# Patient Record
Sex: Female | Born: 1935 | Race: White | Hispanic: No | Smoking: Former smoker
Health system: Southern US, Community
[De-identification: ages and names within clinical notes are randomized; demographics above are authoritative.]

## PROBLEM LIST (undated history)

## (undated) DIAGNOSIS — E785 Hyperlipidemia, unspecified: Secondary | ICD-10-CM

## (undated) DIAGNOSIS — Z5189 Encounter for other specified aftercare: Secondary | ICD-10-CM

## (undated) DIAGNOSIS — I10 Essential (primary) hypertension: Secondary | ICD-10-CM

## (undated) DIAGNOSIS — G629 Polyneuropathy, unspecified: Secondary | ICD-10-CM

## (undated) DIAGNOSIS — K219 Gastro-esophageal reflux disease without esophagitis: Secondary | ICD-10-CM

## (undated) DIAGNOSIS — K2980 Duodenitis without bleeding: Secondary | ICD-10-CM

## (undated) DIAGNOSIS — K2990 Gastroduodenitis, unspecified, without bleeding: Secondary | ICD-10-CM

## (undated) DIAGNOSIS — Z860100 Personal history of colon polyps, unspecified: Secondary | ICD-10-CM

## (undated) DIAGNOSIS — K2970 Gastritis, unspecified, without bleeding: Secondary | ICD-10-CM

## (undated) DIAGNOSIS — C50919 Malignant neoplasm of unspecified site of unspecified female breast: Secondary | ICD-10-CM

## (undated) DIAGNOSIS — J8410 Pulmonary fibrosis, unspecified: Secondary | ICD-10-CM

## (undated) DIAGNOSIS — K5730 Diverticulosis of large intestine without perforation or abscess without bleeding: Secondary | ICD-10-CM

## (undated) DIAGNOSIS — K648 Other hemorrhoids: Secondary | ICD-10-CM

## (undated) HISTORY — PX: MASTECTOMY: SHX3

## (undated) HISTORY — DX: Diverticulosis of large intestine without perforation or abscess without bleeding: K57.30

## (undated) HISTORY — DX: Duodenitis without bleeding: K29.80

## (undated) HISTORY — DX: Malignant neoplasm of unspecified site of unspecified female breast: C50.919

## (undated) HISTORY — DX: Hyperlipidemia, unspecified: E78.5

## (undated) HISTORY — DX: Gastritis, unspecified, without bleeding: K29.70

## (undated) HISTORY — DX: Encounter for other specified aftercare: Z51.89

## (undated) HISTORY — DX: Essential (primary) hypertension: I10

## (undated) HISTORY — DX: Personal history of colon polyps, unspecified: Z86.0100

## (undated) HISTORY — PX: APPENDECTOMY: SHX54

## (undated) HISTORY — PX: TONSILLECTOMY: SUR1361

## (undated) HISTORY — DX: Gastroduodenitis, unspecified, without bleeding: K29.90

## (undated) HISTORY — DX: Other hemorrhoids: K64.8

## (undated) HISTORY — DX: Personal history of colonic polyps: Z86.010

## (undated) HISTORY — PX: TOTAL ABDOMINAL HYSTERECTOMY: SHX209

## (undated) HISTORY — DX: Gastro-esophageal reflux disease without esophagitis: K21.9

## (undated) HISTORY — DX: Polyneuropathy, unspecified: G62.9

---

## 1998-06-26 ENCOUNTER — Other Ambulatory Visit: Admission: RE | Admit: 1998-06-26 | Discharge: 1998-06-26 | Payer: Self-pay | Admitting: Family Medicine

## 1999-06-18 ENCOUNTER — Other Ambulatory Visit: Admission: RE | Admit: 1999-06-18 | Discharge: 1999-06-18 | Payer: Self-pay | Admitting: Family Medicine

## 1999-11-24 ENCOUNTER — Encounter: Admission: RE | Admit: 1999-11-24 | Discharge: 1999-11-24 | Payer: Self-pay | Admitting: Hematology and Oncology

## 1999-11-24 ENCOUNTER — Encounter: Payer: Self-pay | Admitting: Hematology and Oncology

## 2000-07-28 ENCOUNTER — Other Ambulatory Visit: Admission: RE | Admit: 2000-07-28 | Discharge: 2000-07-28 | Payer: Self-pay | Admitting: Family Medicine

## 2000-09-29 ENCOUNTER — Other Ambulatory Visit: Admission: RE | Admit: 2000-09-29 | Discharge: 2000-09-29 | Payer: Self-pay | Admitting: Gastroenterology

## 2000-09-29 ENCOUNTER — Encounter (INDEPENDENT_AMBULATORY_CARE_PROVIDER_SITE_OTHER): Payer: Self-pay | Admitting: Specialist

## 2000-11-24 ENCOUNTER — Encounter: Payer: Self-pay | Admitting: Family Medicine

## 2000-11-24 ENCOUNTER — Encounter: Admission: RE | Admit: 2000-11-24 | Discharge: 2000-11-24 | Payer: Self-pay | Admitting: Family Medicine

## 2001-08-09 ENCOUNTER — Other Ambulatory Visit: Admission: RE | Admit: 2001-08-09 | Discharge: 2001-08-09 | Payer: Self-pay | Admitting: Family Medicine

## 2001-11-28 ENCOUNTER — Encounter: Payer: Self-pay | Admitting: Family Medicine

## 2001-11-28 ENCOUNTER — Encounter: Admission: RE | Admit: 2001-11-28 | Discharge: 2001-11-28 | Payer: Self-pay | Admitting: Family Medicine

## 2002-11-14 ENCOUNTER — Encounter: Payer: Self-pay | Admitting: Family Medicine

## 2002-11-14 ENCOUNTER — Encounter: Admission: RE | Admit: 2002-11-14 | Discharge: 2002-11-14 | Payer: Self-pay | Admitting: Family Medicine

## 2003-11-15 ENCOUNTER — Encounter: Admission: RE | Admit: 2003-11-15 | Discharge: 2003-11-15 | Payer: Self-pay | Admitting: Family Medicine

## 2004-01-31 ENCOUNTER — Encounter: Admission: RE | Admit: 2004-01-31 | Discharge: 2004-01-31 | Payer: Self-pay | Admitting: Family Medicine

## 2004-11-05 ENCOUNTER — Ambulatory Visit: Payer: Self-pay | Admitting: Family Medicine

## 2004-11-12 ENCOUNTER — Encounter: Admission: RE | Admit: 2004-11-12 | Discharge: 2004-11-12 | Payer: Self-pay | Admitting: Family Medicine

## 2004-12-03 ENCOUNTER — Encounter: Admission: RE | Admit: 2004-12-03 | Discharge: 2004-12-03 | Payer: Self-pay | Admitting: Family Medicine

## 2005-06-11 ENCOUNTER — Ambulatory Visit: Payer: Self-pay | Admitting: Family Medicine

## 2005-11-09 ENCOUNTER — Encounter: Admission: RE | Admit: 2005-11-09 | Discharge: 2005-11-09 | Payer: Self-pay | Admitting: Family Medicine

## 2005-11-09 ENCOUNTER — Ambulatory Visit: Payer: Self-pay | Admitting: Family Medicine

## 2005-12-08 ENCOUNTER — Encounter: Admission: RE | Admit: 2005-12-08 | Discharge: 2005-12-08 | Payer: Self-pay | Admitting: Family Medicine

## 2006-11-11 ENCOUNTER — Ambulatory Visit: Payer: Self-pay | Admitting: Family Medicine

## 2006-11-11 ENCOUNTER — Encounter: Admission: RE | Admit: 2006-11-11 | Discharge: 2006-11-11 | Payer: Self-pay | Admitting: Family Medicine

## 2006-11-11 LAB — CONVERTED CEMR LAB
ALT: 13 units/L (ref 0–40)
AST: 14 units/L (ref 0–37)
Alkaline Phosphatase: 90 units/L (ref 39–117)
BUN: 13 mg/dL (ref 6–23)
Basophils Absolute: 0 10*3/uL (ref 0.0–0.1)
Basophils Relative: 1 % (ref 0.0–1.0)
Chol/HDL Ratio, serum: 3
Cholesterol: 209 mg/dL (ref 0–200)
Creatinine, Ser: 1 mg/dL (ref 0.4–1.2)
Eosinophil percent: 2.1 % (ref 0.0–5.0)
Glucose, Bld: 106 mg/dL — ABNORMAL HIGH (ref 70–99)
HCT: 40.4 % (ref 36.0–46.0)
HDL: 68.7 mg/dL (ref >39.0)
Hemoglobin: 13.3 g/dL (ref 12.0–15.0)
LDL DIRECT: 132 mg/dL
Lymphocytes Relative: 42.5 % (ref 12.0–46.0)
MCHC: 33 g/dL (ref 30.0–36.0)
MCV: 98 fL (ref 78.0–100.0)
Monocytes Absolute: 0.5 10*3/uL (ref 0.2–0.7)
Monocytes Relative: 16.3 % — ABNORMAL HIGH (ref 3.0–11.0)
Neutro Abs: 1.2 10*3/uL — ABNORMAL LOW (ref 1.4–7.7)
Neutrophils Relative %: 38.1 % — ABNORMAL LOW (ref 43.0–77.0)
Platelets: 199 10*3/uL (ref 150–400)
Potassium: 4.3 meq/L (ref 3.5–5.1)
RBC: 4.12 M/uL (ref 3.87–5.11)
RDW: 13.6 % (ref 11.5–14.6)
Sodium: 141 meq/L (ref 135–145)
TSH: 0.55 microintl units/mL (ref 0.35–5.50)
Triglyceride fasting, serum: 48 mg/dL (ref 0–149)
VLDL: 10 mg/dL (ref 0–40)
WBC: 3.2 10*3/uL — ABNORMAL LOW (ref 4.5–10.5)

## 2006-12-09 ENCOUNTER — Encounter: Admission: RE | Admit: 2006-12-09 | Discharge: 2006-12-09 | Payer: Self-pay | Admitting: Family Medicine

## 2006-12-23 ENCOUNTER — Emergency Department (HOSPITAL_COMMUNITY): Admission: EM | Admit: 2006-12-23 | Discharge: 2006-12-24 | Payer: Self-pay | Admitting: Emergency Medicine

## 2007-03-01 ENCOUNTER — Ambulatory Visit: Payer: Self-pay | Admitting: Family Medicine

## 2007-03-03 ENCOUNTER — Ambulatory Visit: Payer: Self-pay | Admitting: Family Medicine

## 2007-11-23 ENCOUNTER — Ambulatory Visit: Payer: Self-pay | Admitting: Family Medicine

## 2007-11-23 DIAGNOSIS — G609 Hereditary and idiopathic neuropathy, unspecified: Secondary | ICD-10-CM | POA: Insufficient documentation

## 2007-11-23 DIAGNOSIS — H9190 Unspecified hearing loss, unspecified ear: Secondary | ICD-10-CM | POA: Insufficient documentation

## 2007-11-23 DIAGNOSIS — K219 Gastro-esophageal reflux disease without esophagitis: Secondary | ICD-10-CM

## 2007-11-23 DIAGNOSIS — I10 Essential (primary) hypertension: Secondary | ICD-10-CM

## 2007-11-23 DIAGNOSIS — E785 Hyperlipidemia, unspecified: Secondary | ICD-10-CM | POA: Insufficient documentation

## 2007-11-23 LAB — CONVERTED CEMR LAB
ALT: 16 units/L (ref 0–35)
AST: 18 units/L (ref 0–37)
Albumin: 3.9 g/dL (ref 3.5–5.2)
Alkaline Phosphatase: 90 units/L (ref 39–117)
BUN: 13 mg/dL (ref 6–23)
Basophils Absolute: 0 10*3/uL (ref 0.0–0.1)
Basophils Relative: 0.8 % (ref 0.0–1.0)
Bilirubin, Direct: 0.2 mg/dL (ref 0.0–0.3)
CO2: 30 meq/L (ref 19–32)
Calcium: 9.4 mg/dL (ref 8.4–10.5)
Chloride: 102 meq/L (ref 96–112)
Cholesterol: 224 mg/dL (ref 0–200)
Creatinine, Ser: 0.9 mg/dL (ref 0.4–1.2)
Direct LDL: 130 mg/dL
Eosinophils Absolute: 0.1 10*3/uL (ref 0.0–0.6)
Eosinophils Relative: 1.7 % (ref 0.0–5.0)
GFR calc Af Amer: 77 mL/min
GFR calc non Af Amer: 64 mL/min
Glucose, Bld: 85 mg/dL (ref 70–99)
HCT: 41.8 % (ref 36.0–46.0)
HDL: 76 mg/dL (ref >39.0)
Hemoglobin: 14.1 g/dL (ref 12.0–15.0)
Lymphocytes Relative: 37 % (ref 12.0–46.0)
MCHC: 33.6 g/dL (ref 30.0–36.0)
MCV: 98.4 fL (ref 78.0–100.0)
Monocytes Absolute: 0.6 10*3/uL (ref 0.2–0.7)
Monocytes Relative: 14.9 % — ABNORMAL HIGH (ref 3.0–11.0)
Neutro Abs: 1.6 10*3/uL (ref 1.4–7.7)
Neutrophils Relative %: 45.6 % (ref 43.0–77.0)
Platelets: 195 10*3/uL (ref 150–400)
Potassium: 4.3 meq/L (ref 3.5–5.1)
RBC: 4.25 M/uL (ref 3.87–5.11)
RDW: 13.8 % (ref 11.5–14.6)
Sodium: 140 meq/L (ref 135–145)
TSH: 0.58 microintl units/mL (ref 0.35–5.50)
Total Bilirubin: 1.3 mg/dL — ABNORMAL HIGH (ref 0.3–1.2)
Total CHOL/HDL Ratio: 2.9
Total Protein: 6.6 g/dL (ref 6.0–8.3)
Triglycerides: 54 mg/dL (ref 0–149)
VLDL: 11 mg/dL (ref 0–40)
WBC: 3.7 10*3/uL — ABNORMAL LOW (ref 4.5–10.5)

## 2007-11-29 ENCOUNTER — Ambulatory Visit: Payer: Self-pay | Admitting: Family Medicine

## 2007-12-14 ENCOUNTER — Encounter: Admission: RE | Admit: 2007-12-14 | Discharge: 2007-12-14 | Payer: Self-pay | Admitting: Family Medicine

## 2007-12-26 ENCOUNTER — Ambulatory Visit: Payer: Self-pay | Admitting: Gastroenterology

## 2008-01-05 ENCOUNTER — Encounter: Payer: Self-pay | Admitting: Gastroenterology

## 2008-01-05 ENCOUNTER — Ambulatory Visit: Payer: Self-pay | Admitting: Gastroenterology

## 2008-10-22 ENCOUNTER — Ambulatory Visit: Payer: Self-pay | Admitting: Family Medicine

## 2008-10-22 LAB — CONVERTED CEMR LAB: Blood Glucose, Fingerstick: 96

## 2008-10-30 ENCOUNTER — Ambulatory Visit: Payer: Self-pay | Admitting: Family Medicine

## 2008-11-28 ENCOUNTER — Ambulatory Visit: Payer: Self-pay | Admitting: Family Medicine

## 2008-11-28 DIAGNOSIS — Z853 Personal history of malignant neoplasm of breast: Secondary | ICD-10-CM

## 2008-11-28 LAB — CONVERTED CEMR LAB
Bilirubin Urine: NEGATIVE
Glucose, Urine, Semiquant: NEGATIVE
Ketones, urine, test strip: NEGATIVE
Nitrite: NEGATIVE
Protein, U semiquant: NEGATIVE
Specific Gravity, Urine: 1.015
Urobilinogen, UA: 0.2
pH: 7

## 2008-11-29 ENCOUNTER — Telehealth: Payer: Self-pay | Admitting: *Deleted

## 2008-12-04 ENCOUNTER — Telehealth: Payer: Self-pay | Admitting: Family Medicine

## 2008-12-06 LAB — CONVERTED CEMR LAB
ALT: 11 units/L (ref 0–35)
AST: 14 units/L (ref 0–37)
Albumin: 3.6 g/dL (ref 3.5–5.2)
Alkaline Phosphatase: 65 units/L (ref 39–117)
BUN: 15 mg/dL (ref 6–23)
Basophils Absolute: 0 10*3/uL (ref 0.0–0.1)
Basophils Relative: 0.8 % (ref 0.0–3.0)
Bilirubin, Direct: 0.1 mg/dL (ref 0.0–0.3)
CO2: 30 meq/L (ref 19–32)
Calcium: 9.3 mg/dL (ref 8.4–10.5)
Chloride: 105 meq/L (ref 96–112)
Cholesterol: 191 mg/dL (ref 0–200)
Creatinine, Ser: 0.8 mg/dL (ref 0.4–1.2)
Eosinophils Absolute: 0.1 10*3/uL (ref 0.0–0.7)
Eosinophils Relative: 1.5 % (ref 0.0–5.0)
GFR calc Af Amer: 89 mL/min
GFR calc non Af Amer: 73 mL/min
Glucose, Bld: 98 mg/dL (ref 70–99)
HCT: 38.1 % (ref 36.0–46.0)
HDL: 74.3 mg/dL (ref >39.0)
Hemoglobin: 13.1 g/dL (ref 12.0–15.0)
LDL Cholesterol: 103 mg/dL — ABNORMAL HIGH (ref 0–99)
Lymphocytes Relative: 32.5 % (ref 12.0–46.0)
MCHC: 34.4 g/dL (ref 30.0–36.0)
MCV: 97.9 fL (ref 78.0–100.0)
Monocytes Absolute: 0.5 10*3/uL (ref 0.1–1.0)
Monocytes Relative: 14.4 % — ABNORMAL HIGH (ref 3.0–12.0)
Neutro Abs: 1.8 10*3/uL (ref 1.4–7.7)
Neutrophils Relative %: 50.8 % (ref 43.0–77.0)
Platelets: 140 10*3/uL — ABNORMAL LOW (ref 150–400)
Potassium: 3.7 meq/L (ref 3.5–5.1)
RBC: 3.89 M/uL (ref 3.87–5.11)
RDW: 14 % (ref 11.5–14.6)
Sodium: 140 meq/L (ref 135–145)
TSH: 0.62 microintl units/mL (ref 0.35–5.50)
Total Bilirubin: 1.5 mg/dL — ABNORMAL HIGH (ref 0.3–1.2)
Total CHOL/HDL Ratio: 2.6
Total Protein: 6.5 g/dL (ref 6.0–8.3)
Triglycerides: 68 mg/dL (ref 0–149)
VLDL: 14 mg/dL (ref 0–40)
WBC: 3.5 10*3/uL — ABNORMAL LOW (ref 4.5–10.5)

## 2008-12-10 ENCOUNTER — Ambulatory Visit: Payer: Self-pay | Admitting: Family Medicine

## 2008-12-18 ENCOUNTER — Encounter: Admission: RE | Admit: 2008-12-18 | Discharge: 2008-12-18 | Payer: Self-pay | Admitting: Family Medicine

## 2008-12-27 ENCOUNTER — Telehealth: Payer: Self-pay | Admitting: *Deleted

## 2008-12-31 ENCOUNTER — Ambulatory Visit: Payer: Self-pay | Admitting: Family Medicine

## 2008-12-31 ENCOUNTER — Telehealth: Payer: Self-pay | Admitting: Family Medicine

## 2009-01-28 ENCOUNTER — Ambulatory Visit: Payer: Self-pay | Admitting: Family Medicine

## 2009-04-04 ENCOUNTER — Ambulatory Visit: Payer: Self-pay | Admitting: Family Medicine

## 2009-04-04 DIAGNOSIS — J189 Pneumonia, unspecified organism: Secondary | ICD-10-CM

## 2009-04-04 DIAGNOSIS — R05 Cough: Secondary | ICD-10-CM | POA: Insufficient documentation

## 2009-04-04 DIAGNOSIS — R059 Cough, unspecified: Secondary | ICD-10-CM | POA: Insufficient documentation

## 2009-04-18 ENCOUNTER — Ambulatory Visit: Payer: Self-pay | Admitting: Family Medicine

## 2009-05-02 ENCOUNTER — Ambulatory Visit: Payer: Self-pay | Admitting: Family Medicine

## 2009-05-20 ENCOUNTER — Telehealth: Payer: Self-pay | Admitting: Speech Pathology

## 2009-05-20 ENCOUNTER — Ambulatory Visit: Payer: Self-pay | Admitting: Family Medicine

## 2009-05-23 ENCOUNTER — Ambulatory Visit: Payer: Self-pay | Admitting: Cardiology

## 2009-05-23 ENCOUNTER — Telehealth: Payer: Self-pay | Admitting: Family Medicine

## 2009-05-31 ENCOUNTER — Ambulatory Visit: Payer: Self-pay | Admitting: Pulmonary Disease

## 2009-05-31 DIAGNOSIS — J8410 Pulmonary fibrosis, unspecified: Secondary | ICD-10-CM

## 2009-05-31 LAB — CONVERTED CEMR LAB
Anti Nuclear Antibody(ANA): NEGATIVE
Rheumatoid fact SerPl-aCnc: <20 intl units/mL (ref 0.0–20.0)
Sed Rate: 74 mm/hr — ABNORMAL HIGH (ref 0–22)

## 2009-06-05 ENCOUNTER — Ambulatory Visit: Payer: Self-pay | Admitting: Pulmonary Disease

## 2009-06-21 ENCOUNTER — Ambulatory Visit: Payer: Self-pay | Admitting: Pulmonary Disease

## 2009-06-27 ENCOUNTER — Telehealth: Payer: Self-pay | Admitting: *Deleted

## 2009-09-23 ENCOUNTER — Ambulatory Visit: Payer: Self-pay | Admitting: Pulmonary Disease

## 2009-09-23 DIAGNOSIS — J209 Acute bronchitis, unspecified: Secondary | ICD-10-CM

## 2009-10-16 ENCOUNTER — Ambulatory Visit: Payer: Self-pay | Admitting: Pulmonary Disease

## 2009-11-27 ENCOUNTER — Ambulatory Visit: Payer: Self-pay | Admitting: Pulmonary Disease

## 2009-12-16 ENCOUNTER — Ambulatory Visit: Payer: Self-pay | Admitting: Family Medicine

## 2009-12-16 LAB — CONVERTED CEMR LAB
Bilirubin Urine: NEGATIVE
Glucose, Urine, Semiquant: NEGATIVE
Ketones, urine, test strip: NEGATIVE
Nitrite: NEGATIVE
Specific Gravity, Urine: 1.015
Urobilinogen, UA: 0.2
pH: 7

## 2009-12-17 ENCOUNTER — Encounter: Payer: Self-pay | Admitting: Family Medicine

## 2009-12-17 LAB — CONVERTED CEMR LAB
Basophils Absolute: 0.1 10*3/uL (ref 0.0–0.1)
Basophils Relative: 1 % (ref 0–1)
Eosinophils Absolute: 0.3 10*3/uL (ref 0.0–0.7)
Eosinophils Relative: 6 % — ABNORMAL HIGH (ref 0–5)
HCT: 37.7 % (ref 36.0–46.0)
Hemoglobin: 12.4 g/dL (ref 12.0–15.0)
Lymphocytes Relative: 45 % (ref 12–46)
Lymphs Abs: 1.8 10*3/uL (ref 0.7–4.0)
MCHC: 32.9 g/dL (ref 30.0–36.0)
MCV: 93.8 fL (ref 78.0–100.0)
Monocytes Absolute: 0.6 10*3/uL (ref 0.1–1.0)
Monocytes Relative: 16 % — ABNORMAL HIGH (ref 3–12)
Neutro Abs: 1.3 10*3/uL — ABNORMAL LOW (ref 1.7–7.7)
Neutrophils Relative %: 32 % — ABNORMAL LOW (ref 43–77)
Platelets: 239 10*3/uL (ref 150–400)
RBC: 4.02 M/uL (ref 3.87–5.11)
RDW: 14.6 % (ref 11.5–15.5)
WBC: 4 10*3/uL (ref 4.0–10.5)

## 2009-12-19 ENCOUNTER — Encounter: Admission: RE | Admit: 2009-12-19 | Discharge: 2009-12-19 | Payer: Self-pay | Admitting: Family Medicine

## 2009-12-19 LAB — CONVERTED CEMR LAB
ALT: 11 units/L (ref 0–35)
AST: 13 units/L (ref 0–37)
Albumin: 3.6 g/dL (ref 3.5–5.2)
Alkaline Phosphatase: 92 units/L (ref 39–117)
BUN: 18 mg/dL (ref 6–23)
Bilirubin, Direct: 0 mg/dL (ref 0.0–0.3)
CO2: 29 meq/L (ref 19–32)
Calcium: 9.6 mg/dL (ref 8.4–10.5)
Chloride: 105 meq/L (ref 96–112)
Cholesterol: 188 mg/dL (ref 0–200)
Creatinine, Ser: 1 mg/dL (ref 0.4–1.2)
GFR calc non Af Amer: 56.4 mL/min (ref >60)
Glucose, Bld: 102 mg/dL — ABNORMAL HIGH (ref 70–99)
HDL: 74.8 mg/dL (ref >39.00)
LDL Cholesterol: 102 mg/dL — ABNORMAL HIGH (ref 0–99)
Potassium: 4.9 meq/L (ref 3.5–5.1)
Sodium: 141 meq/L (ref 135–145)
TSH: 0.43 microintl units/mL (ref 0.35–5.50)
Total Bilirubin: 1.2 mg/dL (ref 0.3–1.2)
Total CHOL/HDL Ratio: 3
Total Protein: 7.7 g/dL (ref 6.0–8.3)
Triglycerides: 58 mg/dL (ref 0.0–149.0)
VLDL: 11.6 mg/dL (ref 0.0–40.0)

## 2010-03-27 ENCOUNTER — Ambulatory Visit: Payer: Self-pay | Admitting: Pulmonary Disease

## 2010-05-29 ENCOUNTER — Ambulatory Visit: Payer: Self-pay | Admitting: Pulmonary Disease

## 2010-07-28 ENCOUNTER — Ambulatory Visit: Payer: Self-pay | Admitting: Pulmonary Disease

## 2010-07-28 ENCOUNTER — Telehealth: Payer: Self-pay | Admitting: Family Medicine

## 2010-09-07 IMAGING — CR DG CHEST 2V
2 series · 2 of 2 positions shown · non-contrast
Comparison: Chest radiograph 04/04/2009, 12/02/2008, 11/29/2007

CLINICAL DATA: Recurrent coughing and short of breath

CHEST - 2 VIEW

[view not recorded (1 of 2)]
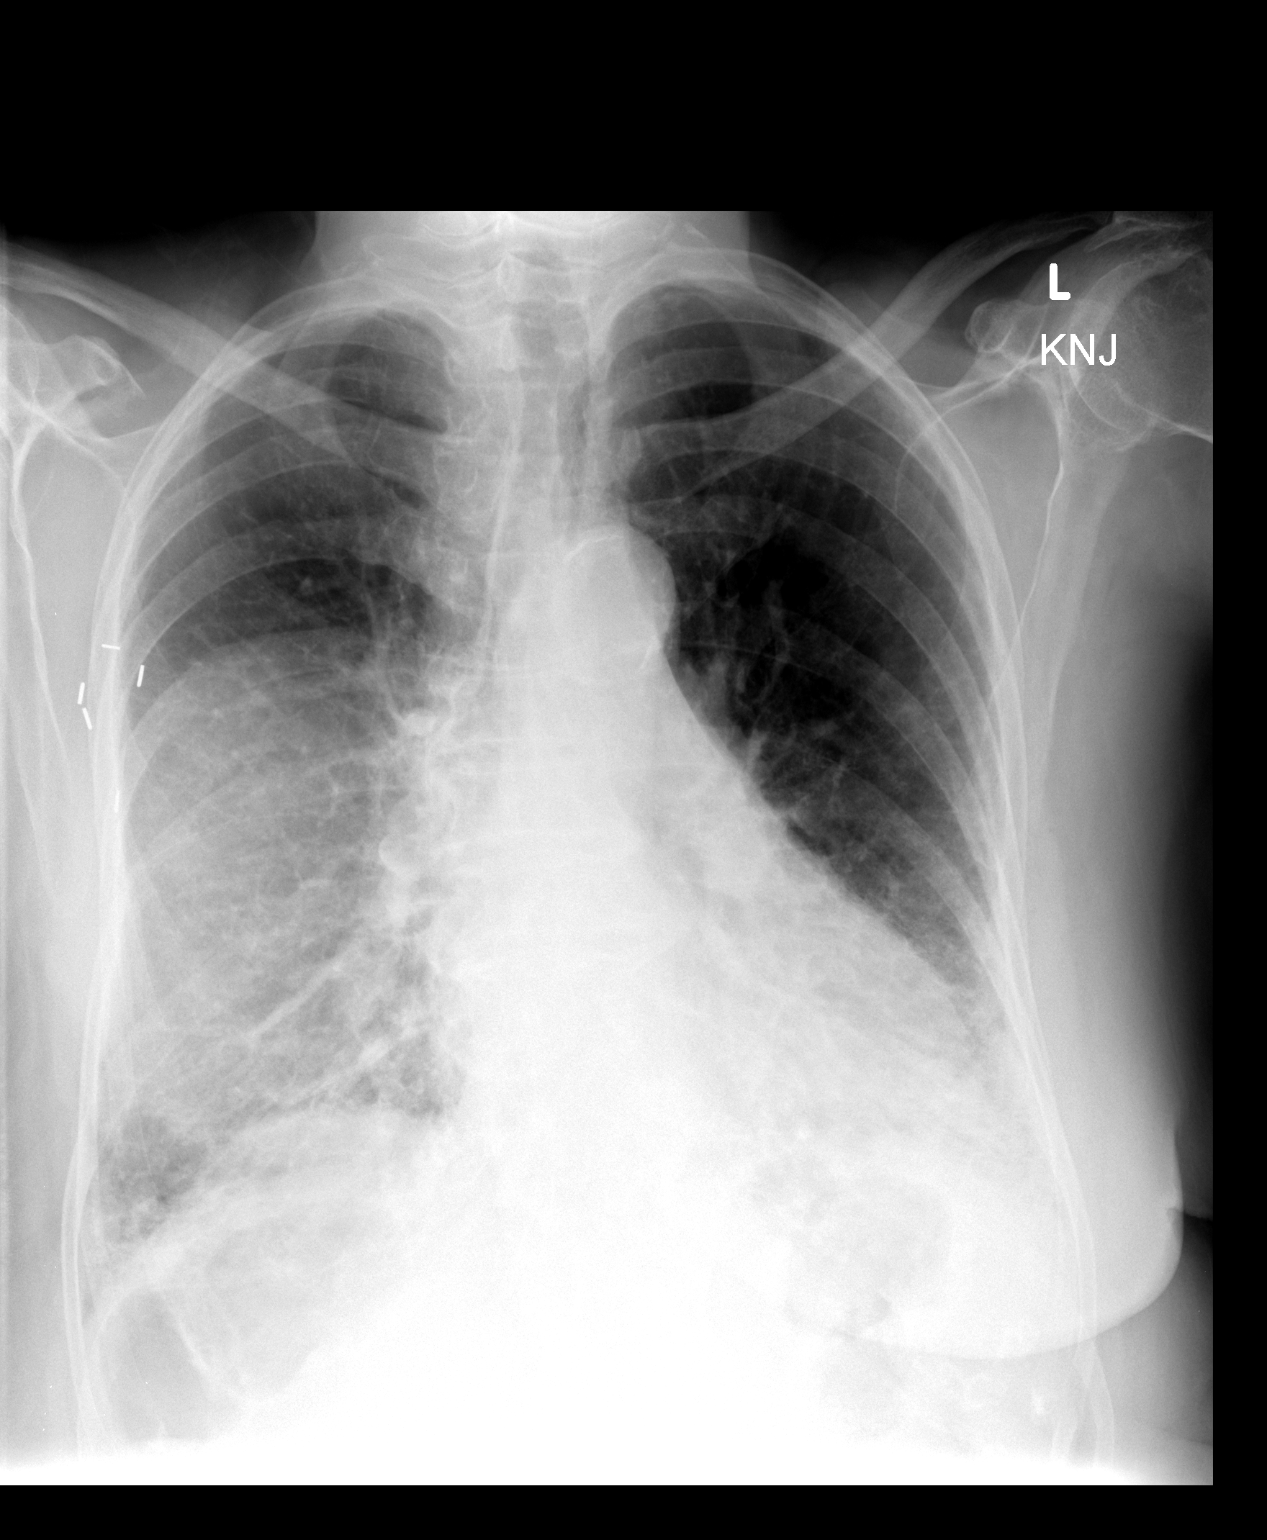

[view not recorded (2 of 2)]
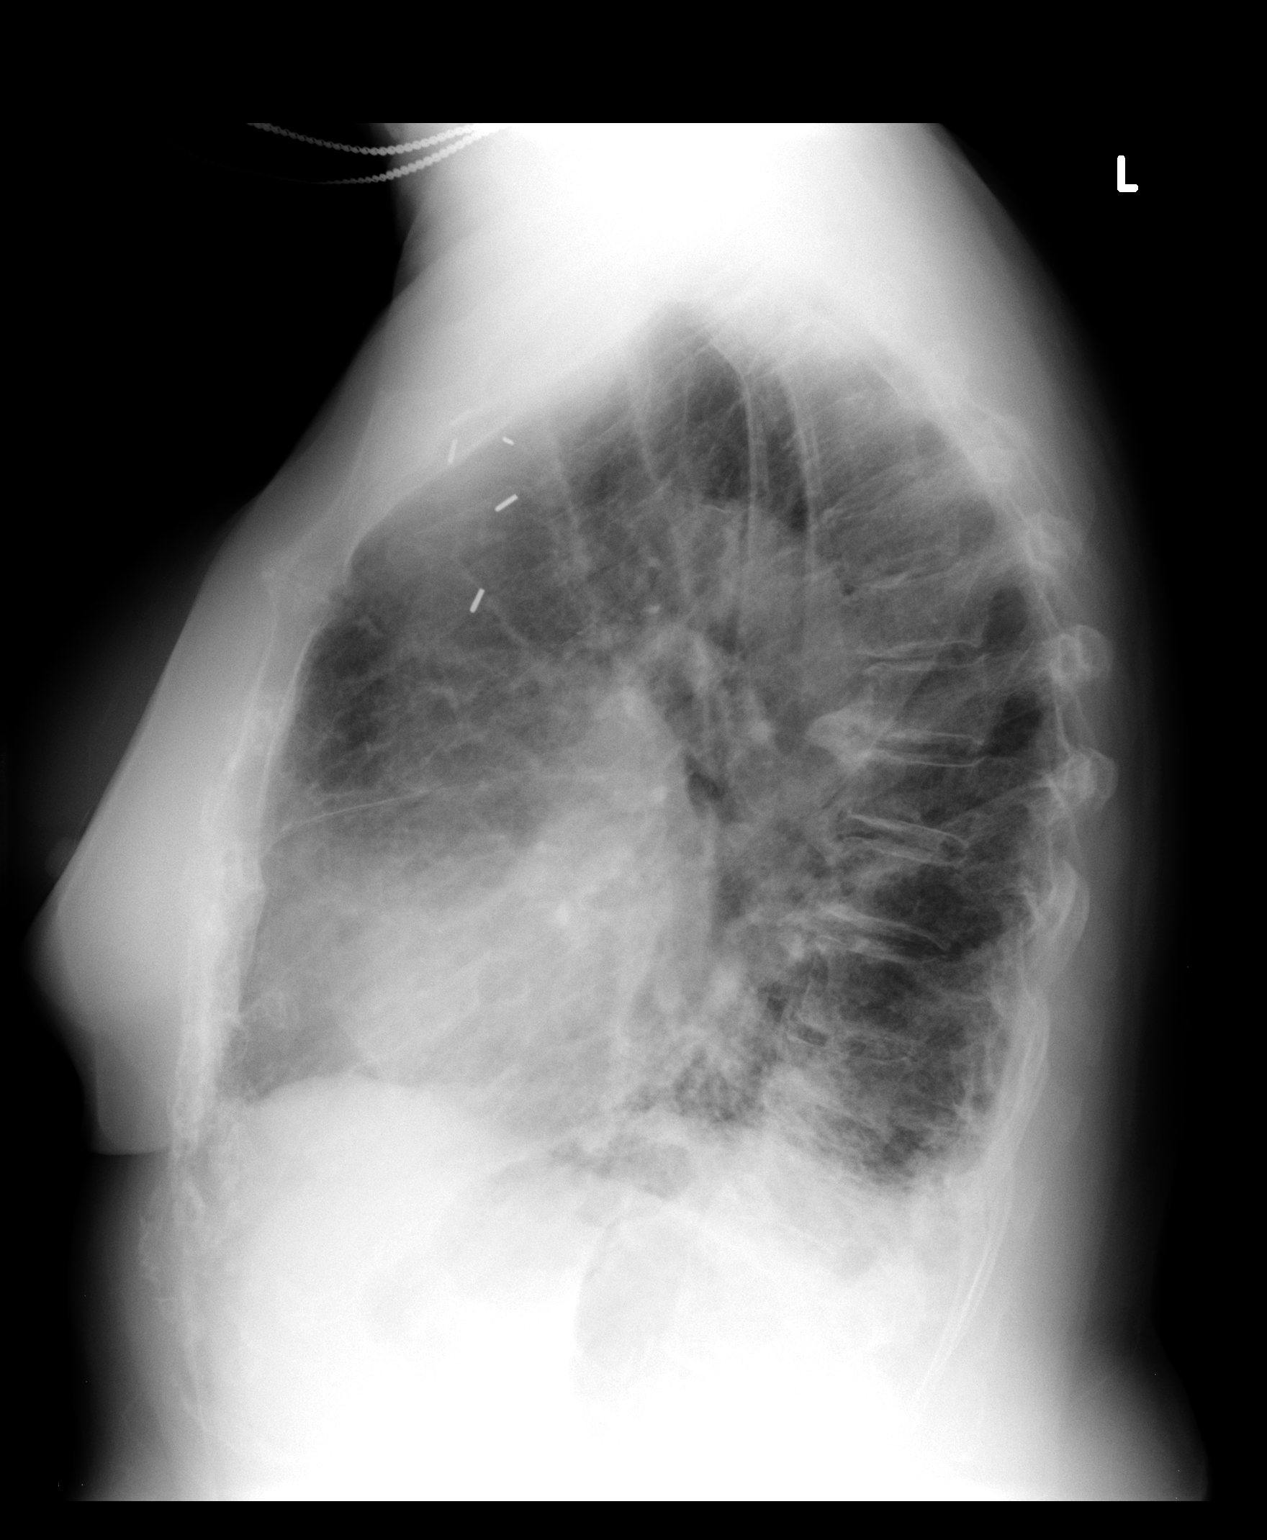

[2 of 2 positions shown; findings below may reference images not displayed]

FINDINGS: Stable enlarged cardiac silhouette.  There is a
persistent soft tissue opacity over the right lung on multiple
comparison exams consistent with reconstructive breast surgery.
There is increasing air space disease with linear interstitial
markings at the lung bases over multiple comparison exams.  Upper
lungs appear clear.  Lateral projection demonstrates a sclerotic
lesion within the sternal body which is unchanged from prior.
Degenerative changes of the thoracic spine.
IMPRESSION: 1.  Progressive air space disease at the bilateral lung bases over
several months.  Likely a component interstitial lung disease
additionally.  Consider high resolution CT thorax to further
evaluate this bilateral lower lobe process.

2. Sclerotic lesion within the sternum appears stable compared back
to the 2228. This may simply represent hypertrophy at the
costochondral junction.

## 2010-12-22 ENCOUNTER — Encounter
Admission: RE | Admit: 2010-12-22 | Discharge: 2010-12-22 | Payer: Self-pay | Source: Home / Self Care | Attending: Family Medicine | Admitting: Family Medicine

## 2010-12-22 LAB — HM MAMMOGRAPHY

## 2011-01-02 ENCOUNTER — Ambulatory Visit
Admission: RE | Admit: 2011-01-02 | Discharge: 2011-01-02 | Payer: Self-pay | Source: Home / Self Care | Attending: Family Medicine | Admitting: Family Medicine

## 2011-01-02 ENCOUNTER — Encounter: Payer: Self-pay | Admitting: Family Medicine

## 2011-01-02 ENCOUNTER — Other Ambulatory Visit: Payer: Self-pay | Admitting: Family Medicine

## 2011-01-02 LAB — CBC WITH DIFFERENTIAL/PLATELET
Basophils Absolute: 0 10*3/uL (ref 0.0–0.1)
Basophils Relative: 0.9 % (ref 0.0–3.0)
Eosinophils Absolute: 0.6 10*3/uL (ref 0.0–0.7)
Eosinophils Relative: 15.4 % — ABNORMAL HIGH (ref 0.0–5.0)
HCT: 37.5 % (ref 36.0–46.0)
Hemoglobin: 12.7 g/dL (ref 12.0–15.0)
Lymphocytes Relative: 40.1 % (ref 12.0–46.0)
Lymphs Abs: 1.5 10*3/uL (ref 0.7–4.0)
MCHC: 33.8 g/dL (ref 30.0–36.0)
MCV: 99.2 fl (ref 78.0–100.0)
Monocytes Absolute: 0.5 10*3/uL (ref 0.1–1.0)
Monocytes Relative: 12.1 % — ABNORMAL HIGH (ref 3.0–12.0)
Neutro Abs: 1.2 10*3/uL — ABNORMAL LOW (ref 1.4–7.7)
Neutrophils Relative %: 31.5 % — ABNORMAL LOW (ref 43.0–77.0)
Platelets: 188 10*3/uL (ref 150.0–400.0)
RBC: 3.78 Mil/uL — ABNORMAL LOW (ref 3.87–5.11)
RDW: 15.5 % — ABNORMAL HIGH (ref 11.5–14.6)
WBC: 3.8 10*3/uL — ABNORMAL LOW (ref 4.5–10.5)

## 2011-01-02 LAB — CONVERTED CEMR LAB
Bilirubin Urine: NEGATIVE
Glucose, Urine, Semiquant: NEGATIVE
Ketones, urine, test strip: NEGATIVE
Nitrite: NEGATIVE
Specific Gravity, Urine: 1.015
pH: 7

## 2011-01-02 LAB — BASIC METABOLIC PANEL
BUN: 16 mg/dL (ref 6–23)
CO2: 31 mEq/L (ref 19–32)
Calcium: 9.3 mg/dL (ref 8.4–10.5)
Chloride: 106 mEq/L (ref 96–112)
Creatinine, Ser: 0.8 mg/dL (ref 0.4–1.2)
GFR: 72.77 mL/min (ref >60.00)
Glucose, Bld: 90 mg/dL (ref 70–99)
Potassium: 4 mEq/L (ref 3.5–5.1)
Sodium: 143 mEq/L (ref 135–145)

## 2011-01-02 LAB — HEPATIC FUNCTION PANEL
ALT: 12 U/L (ref 0–35)
AST: 14 U/L (ref 0–37)
Albumin: 3.7 g/dL (ref 3.5–5.2)
Alkaline Phosphatase: 47 U/L (ref 39–117)
Bilirubin, Direct: 0.2 mg/dL (ref 0.0–0.3)
Total Bilirubin: 1.3 mg/dL — ABNORMAL HIGH (ref 0.3–1.2)
Total Protein: 6.9 g/dL (ref 6.0–8.3)

## 2011-01-02 LAB — TSH: TSH: 0.5 u[IU]/mL (ref 0.35–5.50)

## 2011-01-13 NOTE — Assessment & Plan Note (Signed)
Summary: Acute NP office visit    Copy to:  Dr. Kelle Darting Primary Provider/Referring Provider:  Roderick Pee MD  CC:  chest congestion, dry hacky cough, and increased SOB x4days.  History of Present Illness: 84/F, remote smoker, with ILD, preserved lung function Presented with  cough & dyspnea since 2/10  She underwent lumpectomy & LN dissection in '85 but had skin recurrence & required mastectomy with breast implant in '89 followed by chemo-RT. Her serial CXRs show a haze over the lower Rt hemithorax due to the implant which have been misinterpreted as pneumonia. She received Biaxin a few weeks ago. She c/o recurrent cough , a feeling of 'strangling' in her throat & progressive dyspnea on exertion.  Zestril was stopped 05/02/09. Codeine syrup makes her nauseous but she can tolerate this at night. Prilosec has been started for GERD, she has no symptoms of heartburn. CT chest 6/10 >> mediastinal lymphadenopathy, extensive fibrosis & honeycombing BLL& pulmonary arterial dilation.  7/9 >>COugh better with prednisone & tessalon.ANA, RA neg, ESR 74 PFTs 6/10 >> lung volumes preserved, DLCO 51%  September 23, 2009--Presents for an acute office visit. Complains of 1 week of cough, congestion, thick yellow stringy mucus Incrased nasal drainage. -clear. . Feels weak, tired. Increased wheeizng and dyspnea.  November 27, 2009 10:08 AM  Much improved, tessalon helps,   .  March 27, 2010 --Presents for 4 month follow up - pt states she is doing well, no complaints. This is a very  nice lady that says she has done exceptionally well since last visit. Cough and congesiton have been rare. No use of cough meds.  Her mother is turning 101 this month.  May 29, 2010--Presents for an acute office visit. Complains of chest congestion, dry hacky cough, increased SOB x4days. Felt chills and fever. OTC cold meds not helping. Tessalon helps wants a refill. Denies chest pain,  orthopnea, hemoptysis, fever, n/v/d,  edema, headache.   Medications Prior to Update: 1)  Toprol Xl 100 Mg  Tb24 (Metoprolol Succinate) .... 1/2 Tab  Two Times A Day 2)  Pravachol 40 Mg  Tabs (Pravastatin Sodium) .... 1/2 Tab Q Hs 3)  Lasix 20 Mg  Tabs (Furosemide) .... Take 1 Tablet By Mouth Once A Day 4)  Prilosec Otc 20 Mg Tbec (Omeprazole Magnesium) .... Take 1 Tablet By Mouth Once A Day Before Meal 5)  Aspirin 81 Mg Tbec (Aspirin) .... Take 1 Tablet By Mouth Once A Day 6)  Vitamin E 400 Unit Caps (Vitamin E) .... Take 1 Capsule By Mouth Once A Day 7)  Calcium Carbonate-Vitamin D 600-400 Mg-Unit  Tabs (Calcium Carbonate-Vitamin D) .... Take 1 Tablet By Mouth Once A Day 8)  Naprosyn 500 Mg  Tabs (Naproxen) .... As Needed 9)  Norvasc 5 Mg Tabs (Amlodipine Besylate) .... Take 1 Tablet By Mouth Every Morning 10)  Motrin Ib 200 Mg Tabs (Ibuprofen) .... As Needed 11)  Benzonatate 100 Mg Caps (Benzonatate) .... As Directed  Current Medications (verified): 1)  Toprol Xl 100 Mg  Tb24 (Metoprolol Succinate) .... 1/2 Tab  Two Times A Day 2)  Pravachol 40 Mg  Tabs (Pravastatin Sodium) .... 1/2 Tab Q Hs 3)  Lasix 20 Mg  Tabs (Furosemide) .... Take 1 Tablet By Mouth Once A Day 4)  Prilosec Otc 20 Mg Tbec (Omeprazole Magnesium) .... Take 1 Tablet By Mouth Once A Day Before Meal 5)  Aspirin 81 Mg Tbec (Aspirin) .... Take 1 Tablet By Mouth Once A  Day 6)  Vitamin E 400 Unit Caps (Vitamin E) .... Take One Tablet By Mouth Every Other Day 7)  Calcium Carbonate-Vitamin D 600-400 Mg-Unit  Tabs (Calcium Carbonate-Vitamin D) .... Take 1 Tablet By Mouth Once A Day 8)  Naprosyn 500 Mg  Tabs (Naproxen) .... As Needed 9)  Norvasc 5 Mg Tabs (Amlodipine Besylate) .... Take 1 Tablet By Mouth Every Morning 10)  Motrin Ib 200 Mg Tabs (Ibuprofen) .... As Needed 11)  Benzonatate 100 Mg Caps (Benzonatate) .... As Directed  Allergies (verified): 1)  ! Pcn 2)  ! * Demorol 3)  ! Codeine 4)  ! * Tetanus-  Horse Serum  Past  History:  Past Medical History: Last updated: 09/23/2009 GERD Hyperlipidemia Hypertension Peripheral neuropathy status post breast cancer, right with mastectomy and reconstruction surgery 1989, asymptomatic status post TAH status post tonsillectomy status post lipoma right chest wall status post ovarian cyst Breast cancer, hx of  ILD --CT chest 6/10 >> mediastinal lymphadenopathy, extensive fibrosis & honeycombing BLL& pulmonary arterial dilation. 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 -7/9 >>COugh better with prednisone & tessalon.ANA, RA neg, ESR 74 PFTs 6/10 >> lung volumes preserved, DLCO 51%  Review of Systems      See HPI  Vital Signs:  Patient profile:   77 year old female Height:      63 inches Weight:      157.31 pounds BMI:     27.97 O2 Sat:      97 % on Room air Temp:     98.2 degrees F oral Pulse rate:   65 / minute BP sitting:   114 / 54  (left arm) Cuff size:   regular  Vitals Entered By: Boone Master CNA/MA (May 29, 2010 2:05 PM)  O2 Flow:  Room air  Physical Exam  Additional Exam:  Gen. Pleasant, well-nourished, in no distress, normal affect ENT - no lesions, no post nasal drip Neck: No JVD,  no thyromegaly, no carotid bruits Lungs: no use of accessory muscles, no dullness to percussion, bibasal coarse rales bibasliar Cardiovascular RRR 1-2/6 SM  no peripheral edema Musculoskeletal: No deformities, no cyanosis or clubbing      Impression & Recommendations:  Problem # 1:  BRONCHITIS, ACUTE (ICD-466.0)  Omnicef 300mg  two times a day for 7days  Mucinex DM two times a day as needed cough/congestion  INcrease fluids and rest . Tessalon three times a day as needed cough Please contact office for sooner follow up if symptoms do not improve or worsen  follow up Dr. Vassie Loll in 2 months  Her updated medication list for this problem includes:    Benzonatate 100 Mg Caps (Benzonatate) .[name]... 1 by mouth three times a day as needed cough    Cefdinir 300 Mg Caps (Cefdinir) .[name]... 1 by mouth two times a day  Orders: Est. Patient Level III ([address]109)  Medications Added  to Medication List This Visit: 1)  Vitamin E 400 Unit Caps (Vitamin e) .... Take one tablet by mouth every other day 2)  Benzonatate 100 Mg Caps (Benzonatate) .[name].. 1 by mouth three times a day as needed cough 3)  Cefdinir 300 Mg Caps (Cefdinir) .[name].. 1 by mouth two times a day  Complete Medication List: 1)  Toprol Xl 100 Mg Tb24 (Metoprolol succinate) .... 1/2 tab  two times a day 2)  Pravachol 40 Mg Tabs (Pravastatin sodium) .... 1/2 tab q hs 3)  Lasix 20 Mg Tabs (Furosemide) .... Take 1 tablet by mouth once a day 4)  Prilosec Otc 20 Mg Tbec (Omeprazole magnesium) .... Take 1 tablet by mouth once a day before meal 5)  Aspirin 81 Mg Tbec (Aspirin) .... Take 1 tablet by mouth once a day 6)  Vitamin E 400 Unit Caps (Vitamin e) .... Take one tablet by mouth every other day 7)  Calcium Carbonate-vitamin D 600-400 Mg-unit Tabs (Calcium carbonate-vitamin d) .... Take 1 tablet by mouth once a day 8)  Naprosyn 500 Mg Tabs (Naproxen) .... As needed 9)  Norvasc 5 Mg Tabs (Amlodipine besylate) .... Take 1 tablet by mouth every  morning 10)  Motrin Ib 200 Mg Tabs (Ibuprofen) .... As needed 11)  Benzonatate 100 Mg Caps (Benzonatate) .[name].. 1 by mouth three times a day as needed cough 12)  Cefdinir 300 Mg Caps (Cefdinir) .[name].. 1 by mouth two times a day  Patient Instructions: 1)  Omnicef 300mg  two times a day for 7days  2)  Mucinex DM two times a day as needed cough/congestion  3)  INcrease fluids and rest . 4)  Tessalon three times a day as needed cough 5)  Please contact office for sooner follow up if symptoms do not improve or worsen  6)  follow up Dr. Vassie Loll in 2 months  Prescriptions: BENZONATATE 100 MG CAPS (BENZONATATE) 1 by mouth three times a day as needed cough  #30 x 2   Entered and Authorized by:   Rubye Oaks NP   Signed by:   Rubye Oaks NP on 05/29/2010   Method used:   Electronically to        [facility] (retail)       [address]       [place], [place]  [address]       Ph: [phone]       Fax: [phone]   RxID:   [id number] CEFDINIR 300 MG CAPS (CEFDINIR) 1 by mouth two times a day  #14 x 0   Entered and Authorized by:   Rubye Oaks NP   Signed by:   Rubye Oaks NP on 05/29/2010   Method used:   [facility]        [facility]       [address]       [place], [place]  [address]       Ph: [phone]       Fax: [phone]   RxID:   [id number]

## 2011-01-13 NOTE — Assessment & Plan Note (Signed)
Summary: NP follow up - bronchitis   Copy to:  Dr. Kelle Darting Primary Provider/Referring Provider:  Roderick Pee MD  CC:  4 month follow up - pt states she is doing well and no complaints..  History of Present Illness: 84/F, remote smoker, with ILD, preserved lung function Presented with  cough & dyspnea since 2/10  She underwent lumpectomy & LN dissection in '85 but had skin recurrence & required mastectomy with breast implant in '89 followed by chemo-RT. Her serial CXRs show a haze over the lower Rt hemithorax due to the implant which have been misinterpreted as pneumonia. She received Biaxin a few weeks ago. She c/o recurrent cough , a feeling of 'strangling' in her throat & progressive dyspnea on exertion.  Zestril was stopped 05/02/09. Codeine syrup makes her nauseous but she can tolerate this at night. Prilosec has been started for GERD, she has no symptoms of heartburn. CT chest 6/10 >> mediastinal lymphadenopathy, extensive fibrosis & honeycombing BLL& pulmonary arterial dilation.  7/9 >>COugh better with prednisone & tessalon.ANA, RA neg, ESR 74 PFTs 6/10 >> lung volumes preserved, DLCO 51%  September 23, 2009--Presents for an acute office visit. Complains of 1 week of cough, congestion, thick yellow stringy mucus Incrased nasal drainage. -clear. . Feels weak, tired. Increased wheeizng and dyspnea.  November 27, 2009 10:08 AM  Much improved, tessalon helps,   .  March 27, 2010 --Presents for 4 month follow up - pt states she is doing well, no complaints. This is a very  nice lady that says she has done exceptionally well since last visit. Cough and congesiton have been rare. No use of cough meds. Denies chest pain, dyspnea, orthopnea, hemoptysis, fever, n/v/d, edema, headache. Her mother is turning 101 this month.   Medications Prior to Update: 1)  Toprol Xl 100 Mg  Tb24 (Metoprolol Succinate) .... 1/2 Tab  Two Times A Day 2)  Pravachol 40 Mg  Tabs (Pravastatin Sodium) .... 1/2  Tab Q Hs 3)  Lasix 20 Mg  Tabs (Furosemide) .... Take 1 Tablet By Mouth Once A Day 4)  Prilosec Otc 5)  Asa 81 6)  Vit E 7)  Ca+ 400 8)  Naprosyn 500 Mg  Tabs (Naproxen) .... As Needed 9)  Norvasc 5 Mg Tabs (Amlodipine Besylate) .... Take 1 Tablet By Mouth Every Morning 10)  Motrin Ib 200 Mg Tabs (Ibuprofen) .... As Needed 11)  Bensonate  Current Medications (verified): 1)  Toprol Xl 100 Mg  Tb24 (Metoprolol Succinate) .... 1/2 Tab  Two Times A Day 2)  Pravachol 40 Mg  Tabs (Pravastatin Sodium) .... 1/2 Tab Q Hs 3)  Lasix 20 Mg  Tabs (Furosemide) .... Take 1 Tablet By Mouth Once A Day 4)  Prilosec Otc 20 Mg Tbec (Omeprazole Magnesium) .... Take 1 Tablet By Mouth Once A Day Before Meal 5)  Aspirin 81 Mg Tbec (Aspirin) .... Take 1 Tablet By Mouth Once A Day 6)  Vitamin E 400 Unit Caps (Vitamin E) .... Take 1 Capsule By Mouth Once A Day 7)  Calcium Carbonate-Vitamin D 600-400 Mg-Unit  Tabs (Calcium Carbonate-Vitamin D) .... Take 1 Tablet By Mouth Once A Day 8)  Naprosyn 500 Mg  Tabs (Naproxen) .... As Needed 9)  Norvasc 5 Mg Tabs (Amlodipine Besylate) .... Take 1 Tablet By Mouth Every Morning 10)  Motrin Ib 200 Mg Tabs (Ibuprofen) .... As Needed 11)  Benzonatate 100 Mg Caps (Benzonatate) .... As Directed  Allergies (verified): 1)  ! Pcn 2)  ! *  Demorol 3)  ! Codeine 4)  ! * Tetanus-  Horse Serum  Past History:  Past Medical History: Last updated: 09/23/2009 GERD Hyperlipidemia Hypertension Peripheral neuropathy status post breast cancer, right with mastectomy and reconstruction surgery 1989, asymptomatic status post TAH status post tonsillectomy status post lipoma right chest wall status post ovarian cyst Breast cancer, hx of  ILD --CT chest 6/10 >> mediastinal lymphadenopathy, extensive fibrosis & honeycombing BLL& pulmonary arterial dilation. 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 -7/9 >>COugh better with prednisone & tessalon.ANA, RA neg, ESR 74 PFTs 6/10 >> lung volumes preserved, DLCO 51%  Review of Systems      See HPI  Vital Signs:  Patient profile:   77 year old female Height:      63 inches Weight:      161 pounds BMI:     28.62 O2 Sat:      100 % on Room air Temp:     97.8 degrees F oral Pulse rate:   52 / minute BP sitting:   158 / 80  (left arm) Cuff size:   regular  Vitals Entered By: Boone Master CNA (March 27, 2010 9:34 AM)  O2 Flow:  Room air CC: 4 month follow up - pt states she is doing well, no complaints. Is Patient Diabetic? No Comments Medications reviewed with patient Daytime contact number verified with patient. Boone Master CNA  March 27, 2010 9:39 AM    Physical Exam  Additional Exam:  Gen. Pleasant, well-nourished, in no distress, normal affect ENT - no lesions, no post nasal drip Neck: No JVD, no thyromegaly, no carotid bruits Lungs: no use of accessory muscles, no dullness to percussion, bibasal coarse rales bibasliar Cardiovascular RRR 1-2/6 SM  no peripheral edema Musculoskeletal: No deformities, no cyanosis or clubbing      Impression & Recommendations:  Problem # 1:  PULMONARY FIBROSIS (ICD-515) Compensated w/ preserved lung  fxn.  she is doing very well.  follow up Dr. Vassie Loll in 4 months and as needed  Pneumovax is UTD.   Medications Added to Medication List This Visit: 1)  Prilosec Otc 20 Mg Tbec (Omeprazole magnesium) .... Take 1 tablet by mouth once a day before meal 2)  Aspirin 81 Mg Tbec (Aspirin) .... Take 1 tablet by mouth once a day 3)  Vitamin E 400 Unit Caps (Vitamin e) .... Take 1 capsule by mouth once a day 4)  Calcium Carbonate-vitamin D 600-400 Mg-unit Tabs (Calcium carbonate-vitamin d) .... Take 1 tablet by mouth once a day 5)  Benzonatate 100 Mg Caps (Benzonatate) .... As directed  Complete Medication List: 1)  Toprol Xl 100 Mg Tb24 (Metoprolol succinate) .... 1/2 tab  two times a day 2)  Pravachol 40 Mg Tabs (Pravastatin sodium) .... 1/2 tab q hs 3)  Lasix 20 Mg Tabs (Furosemide) .... Take 1 tablet by mouth once  a day 4)  Prilosec Otc 20 Mg Tbec (Omeprazole magnesium) .... Take 1 tablet by mouth once a day before meal 5)  Aspirin 81 Mg Tbec (Aspirin) .... Take 1 tablet by mouth once a day 6)  Vitamin E 400 Unit Caps (Vitamin e) .... Take 1 capsule by mouth once a day 7)  Calcium Carbonate-vitamin D 600-400 Mg-unit Tabs (Calcium carbonate-vitamin d) .... Take 1 tablet by mouth once a day 8)  Naprosyn 500 Mg Tabs (Naproxen) .... As needed 9)  Norvasc 5 Mg Tabs (Amlodipine besylate) .... Take 1 tablet by mouth every morning 10)  Motrin Ib 200 Mg Tabs (Ibuprofen) .... As needed 11)  Benzonatate 100 Mg Caps (Benzonatate) .... As directed  Other Orders: Est. Patient Level II (25366)  Patient Instructions: 1)  Continue on same regimen 2)  follow up Dr. Vassie Loll in 4 months and as needed

## 2011-01-13 NOTE — Assessment & Plan Note (Signed)
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  She has underlying hypertension, which we treat with Toprol 100 mg dose one half tablet b.i.d., Lasix, 20 mg daily, Norvasc, 5 mg daily.  BP 124/78.  She also has hyperlipidemia and is on Pravachol 40 mg dose one half tablet nightly check lipid panel today.  She also takes Motrin p.r.n. for joint pain, and arthritis.  Last year.  She came in for a physical examination and was complaining of shortness of breath.  We referred her to pulmonary.  Dr. Vassie Loll did a pulmonary evaluation.  Diagnosis pulmonary fibrosis secondary to radiation treatment from previous right breast cancer.  She sees him on a Q6 months basis.  No change in symptoms since last year.  She gets routine eye care.  Dental care.  Colonoscopy done, and GI tetanus 2007, seasonal flu 2010, pneumonia vaccine 2005 and 2010, information given on shingles vaccine.  Her brother has a healthcare power of attorney, and financial power-of-attorney.  She is requesting information on a living will.  She states no code blue.  No artificial life support..... ventilato...Marland Kitchenfeeding tubes, etc.asked her to contact her attorney to make an official document  Allergies: 1)  ! Pcn 2)  ! * Demorol 3)  ! Codeine 4)  ! * Tetanus-  Horse Serum  Past History:  Past medical, surgical, family and social histories (including risk factors) reviewed, and no changes noted (except as noted below).  Past  Medical History: Reviewed history from 09/23/2009 and no changes required. GERD Hyperlipidemia Hypertension Peripheral neuropathy status post breast cancer, right with mastectomy and reconstruction surgery 1989, asymptomatic status post TAH status post tonsillectomy status post lipoma right chest wall status post ovarian cyst Breast cancer, hx of  ILD --CT chest 6/10 >> mediastinal lymphadenopathy, extensive fibrosis & honeycombing BLL& pulmonary arterial dilation. -7/9 >>COugh better with prednisone & tessalon.ANA, RA neg, ESR 74 PFTs 6/10 >> lung volumes preserved, DLCO 51%  Past Surgical History: Reviewed history from 05/31/2009 and no changes required. Hysterectomy Tonsillectomy & adnoids CA of R Breast (mastectomy) lipoma r chest ovarian cyst  Family History: Reviewed history from 11/23/2007 and no changes required. Family History of CAD Female 1st degree relative <60 Family History Hypertension glaucoma  Social History: Reviewed history from 11/23/2007 and no changes required. Retired Public house manager Single Never Smoked Former Smoker Alcohol use-no Drug use-no Regular exercise-yes  Review of Systems      See HPI  Physical Exam  General:  Well-developed,well-nourished,in no acute distress; alert,appropriate and cooperative throughout examination Head:  Normocephalic and atraumatic without obvious abnormalities. No apparent alopecia or balding. Eyes:  No corneal or conjunctival inflammation noted. EOMI. Perrla. Funduscopic exam benign, without hemorrhages, exudates or papilledema. Vision grossly normal. Ears:  External ear exam shows no significant lesions or deformities.  Otoscopic examination reveals clear canals, tympanic membranes are intact bilaterally without  bulging, retraction, inflammation or discharge. Hearing is grossly normal bilaterally. Nose:  External nasal examination shows no deformity or inflammation. Nasal mucosa are pink and moist without lesions or  exudates. Mouth:  Oral mucosa and oropharynx without lesions or exudates.  Teeth in good repair. Neck:  No deformities, masses, or tenderness noted. Chest Wall:  No deformities, masses, or tenderness noted. Breasts:  left breast normal right breast reconstructed from previous breast cancer.  No palpable abnormalities Lungs:  symmetrical decreased breath sounds.  No wheezing Heart:  Normal rate and regular rhythm. S1 and S2 normal without gallop, murmur, click, rub or other extra sounds. Abdomen:  Bowel sounds positive,abdomen soft and non-tender without masses, organomegaly or hernias noted. Msk:  No deformity or scoliosis noted of thoracic or lumbar spine.   Pulses:  R and L carotid,radial,femoral,dorsalis pedis and posterior tibial pulses are full and equal bilaterally Extremities:  No clubbing, cyanosis, edema, or deformity noted with normal full range of motion of all joints.   Neurologic:  No cranial nerve deficits noted. Station and gait are normal. Plantar reflexes are down-going bilaterally. DTRs are symmetrical throughout. Sensory, motor and coordinative functions appear intact. Skin:  Intact without suspicious lesions or rashes Cervical Nodes:  No lymphadenopathy noted Axillary Nodes:  No palpable lymphadenopathy Inguinal Nodes:  No significant adenopathy Psych:  Cognition and judgment appear intact. Alert and cooperative with normal attention span and concentration. No apparent delusions, illusions, hallucinations   Impression & Recommendations:  Problem # 1:  PULMONARY FIBROSIS (ICD-515) Assessment Unchanged  Problem # 2:  BREAST CANCER, HX OF (ICD-V10.3) Assessment: Unchanged  Orders: Prescription Created Electronically [phone]) Venipuncture (09811) TLB-Lipid Panel (80061-LIPID) TLB-BMP (Basic Metabolic Panel-BMET) (80048-METABOL) TLB-CBC Platelet - w/Differential (85025-CBCD) TLB-Hepatic/Liver Function Pnl (80076-HEPATIC) TLB-TSH (Thyroid Stimulating Hormone)  (84443-TSH)  Problem # 3:  HYPERTENSION (ICD-401.9) Assessment: Improved  Her updated medication list for this problem includes:    Toprol Xl 100 Mg Tb24 (Metoprolol succinate) .[name]... 1/2 tab  two times a day    Lasix 20 Mg Tabs (Furosemide) .[name]... Take 1 tablet by mouth once a day    Norvasc 5 Mg Tabs (Amlodipine besylate) .[name]... Take 1 tablet by mouth every morning  Orders: Prescription Created Electronically [phone]) Venipuncture (29562) TLB-Lipid Panel (80061-LIPID) TLB-BMP (Basic Metabolic Panel-BMET) (80048-METABOL) TLB-CBC Platelet - w/Differential (85025-CBCD) TLB-Hepatic/Liver Function Pnl (80076-HEPATIC) TLB-TSH (Thyroid Stimulating Hormone) (84443-TSH) UA Dipstick w/o Micro (automated)  (81003)  Problem # 4:  HYPERLIPIDEMIA (ICD-272.4) Assessment: Improved  Her updated medication list for this problem includes:    Pravachol 40 Mg Tabs (Pravastatin sodium) .[name]... 1/2 tab q hs  Orders: Prescription Created Electronically [phone]) Venipuncture (57846) TLB-Lipid Panel (80061-LIPID) TLB-BMP (Basic Metabolic Panel-BMET) (80048-METABOL) TLB-CBC Platelet - w/Differential (85025-CBCD) TLB-Hepatic/Liver Function Pnl (80076-HEPATIC) TLB-TSH (Thyroid Stimulating Hormone) (84443-TSH)  Complete Medication List: 1)  Toprol Xl 100 Mg Tb24 (Metoprolol succinate) .... 1/2 tab  two times a day 2)  Pravachol 40 Mg Tabs (Pravastatin sodium) .... 1/2 tab q hs 3)  Lasix 20 Mg Tabs (Furosemide) .... Take 1 tablet by mouth once a day 4)  Prilosec Otc  5)  Asa 81  6)  Vit E  7)  Ca+ 400  8)  Naprosyn 500 Mg Tabs (Naproxen) .... As needed 9)  Norvasc 5 Mg Tabs (Amlodipine besylate) .... Take 1 tablet by mouth every morning 10)  Motrin Ib 200 Mg Tabs (Ibuprofen) .... As needed 11)  Bensonate   Patient Instructions: 1)  Please schedule a follow-up appointment in 1 year. 2)  Please schedule a follow-up appointment as needed. Prescriptions: NORVASC 5 MG TABS (AMLODIPINE BESYLATE) Take 1  tablet by mouth every morning  #100 x 3   Entered and Authorized by:   Roderick Pee MD   Signed by:   Roderick Pee MD on 12/16/2009   Method used:   Print then Give to Patient   RxID:   [id number] LASIX 20 MG  TABS (FUROSEMIDE) Take 1 tablet by mouth once a day  #100 Tablet x 3   Entered and Authorized by:   Roderick Pee MD   Signed by:   Roderick Pee MD on 12/16/2009   Method used:   Print then Give to Patient   RxID:   [id number] PRAVACHOL 40 MG  TABS (PRAVASTATIN SODIUM) 1/2 tab q hs  #50 x 3   Entered and Authorized by:   Roderick Pee MD   Signed by:   Roderick Pee MD on 12/16/2009   Method used:   Print then Give to Patient   RxID:   [id number] TOPROL XL 100 MG  TB24 (METOPROLOL SUCCINATE) 1/2 tab  two times a day  #50 x 3   Entered and Authorized by:   Roderick Pee MD   Signed by:   Roderick Pee MD on 12/16/2009   Method used:   Print then Give to Patient   RxID:   [id number]    Immunization History:  Pneumovax Immunization History:    Pneumovax:  historical (09/13/2009)    Laboratory Results   Urine Tests    Routine Urinalysis   Color: yellow Appearance: Clear Glucose: negative   (Normal Range: Negative) Bilirubin: negative   (Normal Range: Negative) Ketone: negative   (Normal Range: Negative) Spec. Gravity: 1.015   (Normal Range: 1.003-1.035) Blood: trace-intact   (Normal Range: Negative) pH: 7.0   (Normal Range: 5.0-8.0) Protein: trace   (Normal Range: Negative) Urobilinogen: 0.2   (Normal Range: 0-1) Nitrite: negative   (Normal Range: Negative) Leukocyte Esterace: 1+   (Normal Range: Negative)    Comments: Rita Ohara  December 16, 2009 12:49 PM     Appended Document: pt will come in fasting/njr     Allergies: 1)  ! Pcn 2)  ! * Demorol 3)  ! Codeine 4)  ! * Tetanus-  Horse Serum   Complete Medication List: 1)  Toprol Xl 100 Mg Tb24 (Metoprolol succinate) .... 1/2 tab  two times a day 2)   Pravachol 40 Mg Tabs (Pravastatin sodium) .... 1/2 tab q hs 3)  Lasix 20 Mg Tabs (Furosemide) .... Take 1 tablet by mouth once a day 4)  Prilosec Otc  5)  Asa 81  6)  Vit E  7)  Ca+ 400  8)  Naprosyn 500 Mg Tabs (Naproxen) .... As needed 9)  Norvasc 5 Mg Tabs (Amlodipine besylate) .... Take 1 tablet by mouth every morning 10)  Motrin Ib 200 Mg Tabs (Ibuprofen) .... As needed 11)  Bensonate   Other Orders: EKG w/ Interpretation (93000)  Appended Document: Orders Update    Clinical Lists Changes  Orders: Added new Test order of T- * Misc. Laboratory test [phone]) - Signed

## 2011-01-13 NOTE — Assessment & Plan Note (Signed)
Summary: 2 months/apc   Visit Type:  Follow-up Copy to:  Dr. Kelle Darting Primary Provider/Referring Provider:  Roderick Pee MD  CC:  Pt here for follow up.  History of Present Illness: 84/F, remote smoker, with ILD, preserved lung function Presented with  cough & dyspnea since 2/10  She underwent lumpectomy & LN dissection in '85 but had skin recurrence & required mastectomy with breast implant in '89 followed by chemo-RT. Her serial CXRs show a haze over the lower Rt hemithorax due to the implant which have been misinterpreted as pneumonia. She received Biaxin a few weeks ago. She c/o recurrent cough , a feeling of 'strangling' in her throat & progressive dyspnea on exertion.  Zestril was stopped 05/02/09. Codeine syrup makes her nauseous but she can tolerate this at night. Prilosec has been started for GERD, she has no symptoms of heartburn. CT chest 6/10 >> mediastinal lymphadenopathy, extensive fibrosis & honeycombing BLL& pulmonary arterial dilation.  7/9 >>COugh better with prednisone & tessalon.ANA, RA neg, ESR 74 PFTs 6/10 >> lung volumes preserved, DLCO 51%  6/11 - omnicef for bronchitis  July 28, 2010 1:55 PM  Doing well, some dry cough persists, no dyspnea, c/o heartburn CXR unchanged , did not desaturate on walking   Preventive Screening-Counseling & Management  Alcohol-Tobacco     Smoking Status: quit     Packs/Day: <0.25     Year Started: 1944     Year Quit: 1985  Caffeine-Diet-Exercise     Does Patient Exercise: yes  Current Medications (verified): 1)  Toprol Xl 100 Mg  Tb24 (Metoprolol Succinate) .... 1/2 Tab  Two Times A Day 2)  Pravachol 40 Mg  Tabs (Pravastatin Sodium) .... 1/2 Tab Q Hs 3)  Lasix 20 Mg  Tabs (Furosemide) .... Take 1 Tablet By Mouth Once A Day 4)  Prilosec Otc 20 Mg Tbec (Omeprazole Magnesium) .... Take 1 Tablet By Mouth Once A Day Before Meal As Needed 5)  Aspirin 81 Mg Tbec (Aspirin) .... Take 1 Tablet By Mouth Once A  Day 6)  Vitamin E 400 Unit Caps (Vitamin E) .... Take One Tablet By Mouth Every Other Day 7)  Calcium Carbonate-Vitamin D 600-400 Mg-Unit  Tabs (Calcium Carbonate-Vitamin D) .... Take 1 Tablet By Mouth Once A Day 8)  Naprosyn 500 Mg  Tabs (Naproxen) .... As Needed 9)  Norvasc 5 Mg Tabs (Amlodipine Besylate) .... Take 1 Tablet By Mouth Every Morning 10)  Motrin Ib 200 Mg Tabs (Ibuprofen) .... As Needed 11)  Benzonatate 100 Mg Caps (Benzonatate) .[name].. 1 By Mouth Three Times A Day As Needed Cough  Allergies (verified): 1)  ! Pcn 2)  ! * Demorol 3)  ! Codeine 4)  ! * Tetanus-  Horse Serum  Past History:  Past Medical History: Last updated: 09/23/2009 GERD Hyperlipidemia Hypertension Peripheral neuropathy status post breast cancer, right with mastectomy and reconstruction surgery 1989, asymptomatic status post TAH status post tonsillectomy status post lipoma right chest wall status post ovarian cyst Breast cancer, hx of  ILD --CT chest 6/10 >> mediastinal lymphadenopathy, extensive fibrosis & honeycombing BLL& pulmonary arterial dilation. 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 -7/9 >>COugh better with prednisone &  tessalon.ANA, RA neg, ESR 74 PFTs 6/10 >> lung volumes preserved, DLCO 51%  Social History: Packs/Day:  <0.25  Review of Systems       The patient complains of prolonged cough.  The patient denies anorexia, fever, weight loss, weight gain, vision loss, decreased hearing, hoarseness, chest pain, syncope, dyspnea on exertion, peripheral edema, headaches, hemoptysis, abdominal pain, melena,  hematochezia, severe indigestion/heartburn, hematuria, muscle weakness, suspicious skin lesions, difficulty walking, depression, unusual weight change, and abnormal bleeding.    Vital Signs:  Patient profile:   77 year old female Height:      63 inches Weight:      158.38 pounds BMI:     28.16 O2 Sat:      99 % on Room air Temp:     98.1 degrees F oral Pulse rate:   51 / minute BP sitting:   120 / 60  (left arm) Cuff size:   regular  Vitals Entered By: Zackery Barefoot CMA (July 28, 2010 1:40 PM)  O2 Flow:  Room air  Serial Vital Signs/Assessments:  Comments: Ambulatory Pulse Oximetry  Resting; HR__56___    02 Sat__98%RA___  Lap1 (185 feet)   HR__79___   02 Sat__100%RA___ Lap2 (185 feet)   HR__88___   02 Sat__98%RA___    Lap3 (185 feet)   HR__90___   02 Sat__97%RA___  _x__Test Completed without Difficulty Zackery Barefoot CMA  July 28, 2010 2:08 PM  ___Test Stopped due to:   By: Zackery Barefoot CMA   CC: Pt here for follow up Comments Medications reviewed with patient Verified contact number and pharmacy with patient Zackery Barefoot CMA  July 28, 2010 1:41 PM    Physical Exam  Additional Exam:  Gen. Pleasant, well-nourished, in no distress, normal affect ENT - no lesions, no post nasal drip Neck: No JVD, no thyromegaly, no carotid bruits Lungs: no use of accessory muscles, no dullness to percussion, bibasal fine rales bibasliar Cardiovascular RRR 1-2/6 SM  no peripheral edema Musculoskeletal: No deformities, no cyanosis or clubbing      CXR  Procedure date:  07/28/2010  Findings:      IMPRESSION: Stable pulmonary hyperinflation and chronic bibasilar interstitial disease.  No acute findings.  Impression & Recommendations:  Problem # 1:  PULMONARY FIBROSIS (ICD-515) Well compensated , good lung function. CXR appears stable Will monitor q 6 months PFTs if clinical deterioration.  Medications Added to Medication List This Visit: 1)  Prilosec  Otc 20 Mg Tbec (Omeprazole magnesium) .... Take 1 tablet by mouth once a day before meal as needed  Other Orders: Est. Patient Level III ([address]) T-2 View CXR (71020TC)  Patient Instructions: 1)  Copy sent to:Dr Todd 2)  Please schedule a follow-up appointment in 6 months with TP. 3)  A chest x-ray has been recommended.  Your imaging study may require preauthorization.

## 2011-01-13 NOTE — Progress Notes (Signed)
Summary: rx toprol  Phone Note Refill Request Message from:  Fax from Pharmacy on July 28, 2010 5:03 PM  Refills Requested: Medication #1:  TOPROL XL 100 MG  TB24 1/2 tab  two times a day Initial call taken by: Kern Reap CMA Duncan Dull),  July 28, 2010 5:03 PM    Prescriptions: TOPROL XL 100 MG  TB24 (METOPROLOL SUCCINATE) 1/2 tab  two times a day  #50 x 6   Entered by:   Kern Reap CMA (AAMA)   Authorized by:   Roderick Pee MD   Signed by:   Kern Reap CMA (AAMA) on 07/28/2010   Method used:   Electronically to        [facility] (retail)       [address]       [place], [place]  [address]       Ph: [phone]       Fax: [phone]   RxID:   [id number]

## 2011-01-15 NOTE — Assessment & Plan Note (Signed)
Summary: PT WILL COME IN FASTING / CPX // RS   Vital Signs:  Patient profile:   77 year old female Height:      63 inches Weight:      157 pounds BMI:     27.91 Temp:     98.2 degrees F oral BP sitting:   130 / 74  (left arm) Cuff size:   regular  Vitals Entered By: Kern Reap CMA Duncan Dull) (January 02, 2011 10:50 AM) CC: wellness exam   Primary Care Provider:  Roderick Pee MD  CC:  wellness exam.  History of Present Illness: Amanda Long is a delightful, 77 year old female, single, retired Engineer, civil (consulting)...Marland KitchenMarland KitchenMarland Kitchen who cares for her mother, who is 44........... who comes in today for general Medicare wellness exam.  She takes Toprol 100 mg dose one half tab b.i.d., Lasix, 20 mg daily, Norvasc, 5 mg daily, BP 130/74.  She also takes Pravachol, 20 mg nightly for hyperlipidemia.  She takes aspirin occasionally, Motrin occasionally, calcium, vitamin D daily, and Prilosec OTC 20 mg daily.  Tetanus booster 2007, flu shot 2011, Pneumovax 2010, information given on shingles.  She continues to get yearly mammograms because she had a breast cancer in her right breast surgically removed with an implant.  Left breast continues to be normal.  She also follows regularly and pulmonary because of a chronic cough.  She has pulmonary fibrosis.  Currently on no medication. Here for Medicare AWV:  1.   Risk factors based on Past M, S, F history:.....reviewed none as noted above 2.   Physical Activities: walks daily with a new puppy 3.   Depression/mood: good mood.  No depression 4.   Hearing: normal 5.   ADL's: functions independently 6.   Fall Risk: reviewed and identified 7.   Home Safety: reviewed.  No guns in the house 8.   Height, weight, &visual acuity:height weight, normal.  Vision normal 9.   Counseling: continued health habits 10.   Labs ordered based on risk factors: done today 11.           Referral Coordination.......none indicated 12.           Care Plan....reviewed all medications 13.             Cognitive Assessment .[name]...oriented x 3 financially independent  Allergies: 1)  ! Pcn 2)  ! * Demorol 3)  ! Codeine 4)  ! * Tetanus-  Horse Serum  Past History:  Past medical, surgical, family and social histories (including risk factors) reviewed, and no changes noted (except as noted below).  Past Medical History: Reviewed history from 09/23/2009 and no changes required. GERD Hyperlipidemia Hypertension Peripheral neuropathy status post breast cancer, right with mastectomy and reconstruction surgery 1989, asymptomatic status post TAH status post tonsillectomy status post lipoma right chest wall status post ovarian cyst Breast cancer, hx of  ILD --CT chest 6/10 >> mediastinal lymphadenopathy, extensive fibrosis & honeycombing BLL& pulmonary arterial dilation. -7/9 >>COugh better with prednisone & tessalon.ANA, RA neg, ESR 74 PFTs 6/10 >> lung volumes preserved, DLCO 51%  Past Surgical History: Reviewed history from 05/31/2009 and no changes required. Hysterectomy Tonsillectomy & adnoids CA of R Breast (mastectomy) lipoma r chest ovarian cyst  Family History: Reviewed history from 11/23/2007 and no changes required. Family History of CAD Female 1st degree relative <60 Family History Hypertension glaucoma  Social History: Reviewed history from 11/23/2007 and no changes required. Retired Public house manager Single Never Smoked Former Smoker Alcohol use-no Drug use-no Regular exercise-yes  Review  of Systems      See HPI  Physical Exam  General:  Well-developed,well-nourished,in no acute distress; alert,appropriate and cooperative throughout examination Head:  Normocephalic and atraumatic without obvious abnormalities. No apparent alopecia or balding. Eyes:  No corneal or conjunctival inflammation noted. EOMI. Perrla. Funduscopic exam benign, without hemorrhages, exudates or papilledema. Vision grossly normal. Ears:  External ear exam shows no significant lesions or  deformities.  Otoscopic examination reveals clear canals, tympanic membranes are intact bilaterally without bulging, retraction, inflammation or discharge. Hearing is grossly normal bilaterally. Nose:  External nasal examination shows no deformity or inflammation. Nasal mucosa are pink and moist without lesions or exudates. Mouth:  Oral mucosa and oropharynx without lesions or exudates.  Teeth in good repair. Neck:  No deformities, masses, or tenderness noted. Chest Wall:  No deformities, masses, or tenderness noted. Breasts:  left breast appears normal.  Scars around the nipple and below from previous reduction mammoplasty.  No palpable masses.  Right is reconstructed.  No palpable masses Lungs:  Normal respiratory effort, chest expands symmetrically. Lungs are clear to auscultation, no crackles or wheezes. Heart:  Normal rate and regular rhythm. S1 and S2 normal without gallop, murmur, click, rub or other extra sounds. Abdomen:  Bowel sounds positive,abdomen soft and non-tender without masses, organomegaly or hernias noted. Msk:  No deformity or scoliosis noted of thoracic or lumbar spine.   Pulses:  R and L carotid,radial,femoral,dorsalis pedis and posterior tibial pulses are full and equal bilaterally Extremities:  No clubbing, cyanosis, edema, or deformity noted with normal full range of motion of all joints.   Neurologic:  No cranial nerve deficits noted. Station and gait are normal. Plantar reflexes are down-going bilaterally. DTRs are symmetrical throughout. Sensory, motor and coordinative functions appear intact. Skin:  Intact without suspicious lesions or rashes Cervical Nodes:  No lymphadenopathy noted Axillary Nodes:  No palpable lymphadenopathy Inguinal Nodes:  No significant adenopathy Psych:  Cognition and judgment appear intact. Alert and cooperative with normal attention span and concentration. No apparent delusions, illusions, hallucinations   Impression &  Recommendations:  Problem # 1:  HYPERTENSION (ICD-401.9) Assessment Improved  Her updated medication list for this problem includes:    Toprol Xl 100 Mg Tb24 (Metoprolol succinate) .[name]... 1/2 tab  two times a day    Lasix 20 Mg Tabs (Furosemide) .[name]... Take 1 tablet by mouth once a day    Norvasc 5 Mg Tabs (Amlodipine besylate) .[name]... Take 1 tablet by mouth every morning  Orders: Prescription Created Electronically [phone]) Medicare -1st Annual Wellness Visit [phone]) Urinalysis-dipstick only (Medicare patient) [phone]) Venipuncture (47829) Specimen Handling (56213) EKG w/ Interpretation (93000) TLB-BMP (Basic Metabolic Panel-BMET) (80048-METABOL) TLB-CBC Platelet - w/Differential (85025-CBCD) TLB-Hepatic/Liver Function Pnl (80076-HEPATIC) TLB-TSH (Thyroid Stimulating Hormone) (84443-TSH)  Problem # 2:  HYPERLIPIDEMIA (ICD-272.4) Assessment: Improved  Her updated medication list for this problem includes:    Pravachol 40 Mg Tabs (Pravastatin sodium) .[name]... 1/2 tab q hs  Orders: Prescription Created Electronically [phone]) Medicare -1st Annual Wellness Visit [phone]) Urinalysis-dipstick only (Medicare patient) [phone]) Venipuncture (32440) Specimen Handling (10272) EKG w/ Interpretation (93000) TLB-BMP (Basic Metabolic Panel-BMET) (80048-METABOL) TLB-CBC Platelet - w/Differential (85025-CBCD) TLB-Hepatic/Liver Function Pnl (80076-HEPATIC) TLB-TSH (Thyroid Stimulating Hormone) (84443-TSH)  Problem # 3:  GERD (ICD-530.81) Assessment: Improved  Her updated medication list for this problem includes:    Prilosec Otc 20 Mg Tbec (Omeprazole magnesium) .[name]... Take 1 tablet by mouth once a day before meal as needed  Orders: Prescription Created Electronically [phone]) Medicare -1st Annual Wellness Visit (  Z6109) Urinalysis-dipstick only (Medicare patient) (60454UJ) Venipuncture (81191) TLB-BMP (Basic Metabolic Panel-BMET) (80048-METABOL) TLB-CBC Platelet - w/Differential  (85025-CBCD) TLB-Hepatic/Liver Function Pnl (80076-HEPATIC) TLB-TSH (Thyroid Stimulating Hormone) (84443-TSH)  Problem # 4:  Preventive Health Care (ICD-V70.0) Assessment: Improved  Complete Medication List: 1)  Toprol Xl 100 Mg Tb24 (Metoprolol succinate) .... 1/2 tab  two times a day 2)  Pravachol 40 Mg Tabs (Pravastatin sodium) .... 1/2 tab q hs 3)  Lasix 20 Mg Tabs (Furosemide) .... Take 1 tablet by mouth once a day 4)  Prilosec Otc 20 Mg Tbec (Omeprazole magnesium) .... Take 1 tablet by mouth once a day before meal as needed 5)  Aspirin 81 Mg Tbec (Aspirin) .... Take 1 tablet by mouth once a day 6)  Vitamin E 400 Unit Caps (Vitamin e) .... Take one tablet by mouth every other day 7)  Calcium Carbonate-vitamin D 600-400 Mg-unit Tabs (Calcium carbonate-vitamin d) .... Take 1 tablet by mouth once a day 8)  Naprosyn 500 Mg Tabs (Naproxen) .... As needed 9)  Norvasc 5 Mg Tabs (Amlodipine besylate) .... Take 1 tablet by mouth every morning 10)  Motrin Ib 200 Mg Tabs (Ibuprofen) .... As needed 11)  Benzonatate 100 Mg Caps (Benzonatate) .[name].. 1 by mouth three times a day as needed cough  Patient Instructions: 1)  Please schedule a follow-up appointment in 1 year. Prescriptions: NORVASC 5 MG TABS (AMLODIPINE BESYLATE) Take 1 tablet by mouth every morning  #100 x 3   Entered and Authorized by:   Roderick Pee MD   Signed by:   Roderick Pee MD on 01/02/2011   Method used:   Electronically to        [facility] (retail)       [address]       [place], [place]  [address]       Ph: [phone]       Fax: [phone]   RxID:   [id number] LASIX 20 MG  TABS (FUROSEMIDE) Take 1 tablet by mouth once a day  #100 Tablet x 3   Entered and Authorized by:   Roderick Pee MD   Signed by:   Roderick Pee MD on 01/02/2011   Method used:   Electronically to        [facility] (retail)       [address]       [place], [place]  [address]       Ph: [phone]       Fax: [phone]   RxID:   [id number] PRAVACHOL 40 MG  TABS (PRAVASTATIN SODIUM) 1/2 tab q hs  #50 x 3   Entered and Authorized by:   Roderick Pee MD   Signed by:   Roderick Pee MD on 01/02/2011   Method used:   Electronically to        [facility] (retail)       [address]       [place], [place]  [address]       Ph: [phone]       Fax: [phone]   RxID:   [id number] TOPROL XL 100 MG  TB24 (METOPROLOL SUCCINATE) 1/2 tab  two times a day  #100 x 3   Entered and Authorized by:   Roderick Pee MD  Signed by:   Roderick Pee MD on 01/02/2011   Method used:   Electronically to        [facility] (retail)       [address]       [place], [place]  [address]       Ph: [phone]       Fax: [phone]   RxID:   [id number]    Orders Added: 1)  Prescription Created Electronically [phone] 2)  Medicare -1st Annual Wellness Visit [G0438] 3)  Urinalysis-dipstick only (Medicare patient) [81003QW] 4)  Venipuncture [44010] 5)  Specimen Handling [99000] 6)  EKG w/ Interpretation [93000] 7)  TLB-BMP (Basic Metabolic Panel-BMET) [80048-METABOL] 8)  TLB-CBC Platelet - w/Differential [85025-CBCD] 9)  TLB-Hepatic/Liver Function Pnl [80076-HEPATIC] 10)  TLB-TSH (Thyroid Stimulating Hormone) [84443-TSH]     Laboratory Results   Urine Tests  Date/Time Recieved: January 02, 2011 12:39 PM  Date/Time Reported: January 02, 2011 12:39 PM   Routine Urinalysis   Color: yellow Appearance: Clear Glucose: negative   (Normal Range: Negative) Bilirubin: negative   (Normal Range: Negative) Ketone: negative   (Normal Range: Negative) Spec. Gravity: 1.015   (Normal Range: 1.003-1.035) Blood: trace-lysed   (Normal Range: Negative) pH: 7.0   (Normal Range: 5.0-8.0) Protein: negative   (Normal Range: Negative) Urobilinogen: 0.2   (Normal Range:  0-1) Nitrite: negative   (Normal Range: Negative) Leukocyte Esterace: negative   (Normal Range: Negative)    Comments: Wynona Canes, CMA  January 02, 2011 12:39 PM

## 2011-01-29 ENCOUNTER — Encounter: Payer: Self-pay | Admitting: Adult Health

## 2011-01-29 ENCOUNTER — Ambulatory Visit (INDEPENDENT_AMBULATORY_CARE_PROVIDER_SITE_OTHER): Payer: Medicare Other | Admitting: Adult Health

## 2011-01-29 DIAGNOSIS — J8410 Pulmonary fibrosis, unspecified: Secondary | ICD-10-CM

## 2011-02-04 NOTE — Assessment & Plan Note (Addendum)
Summary: NP follow up - pulm fibrosis   Copy to:  Dr. Kelle Darting Primary Provider/Referring Provider:  Roderick Pee MD  CC:  6 month follow up .  History of Present Illness: 77/F, remote smoker, with ILD, preserved lung function Presented with  cough & dyspnea since 2/10  She underwent lumpectomy & LN dissection in '85 but had skin recurrence & required mastectomy with breast implant in '89 followed by chemo-RT. Her serial CXRs show a haze over the lower Rt hemithorax due to the implant which have been misinterpreted as pneumonia. She received Biaxin a few weeks ago. She c/o recurrent cough , a feeling of 'strangling' in her throat & progressive dyspnea on exertion.  Zestril was stopped 05/02/09. Codeine syrup makes her nauseous but she can tolerate this at night. Prilosec has been started for GERD, she has no symptoms of heartburn. CT chest 6/10 >> mediastinal lymphadenopathy, extensive fibrosis & honeycombing BLL& pulmonary arterial dilation.  7/9 >>COugh better with prednisone & tessalon.ANA, RA neg, ESR 74 PFTs 6/10 >> lung volumes preserved, DLCO 51%  6/11 - omnicef for bronchitis  July 28, 2010--Doing well, some dry cough persists, no dyspnea, c/o heartburn CXR unchanged , did not desaturate on walking  January 29, 2011--Presents for 6 month follow up. Has been doing welll,  trying to stay active. Cares for mom that is 102. Plays with dogs. Occasional cough mainly dry but not worse than usual. Woke up this am with nasal drainage and stuffy nose.  NO change in activity tolerance. Denies chest pain,  orthopnea, hemoptysis, fever, n/v/d, edema, headache. CXR in 8/11 was no acute changes.  Medications Prior to Update: 1)  Toprol Xl 100 Mg  Tb24 (Metoprolol Succinate) .... 1/2 Tab  Two Times A Day 2)  Pravachol 40 Mg  Tabs (Pravastatin Sodium) .... 1/2 Tab Q Hs 3)  Lasix 20 Mg  Tabs (Furosemide) .... Take 1 Tablet By Mouth Once A Day 4)  Prilosec Otc 20 Mg Tbec (Omeprazole  Magnesium) .... Take 1 Tablet By Mouth Once A Day Before Meal As Needed 5)  Aspirin 81 Mg Tbec (Aspirin) .... Take 1 Tablet By Mouth Once A Day 6)  Vitamin E 400 Unit Caps (Vitamin E) .... Take One Tablet By Mouth Every Other Day 7)  Calcium Carbonate-Vitamin D 600-400 Mg-Unit  Tabs (Calcium Carbonate-Vitamin D) .... Take 1 Tablet By Mouth Once A Day 8)  Naprosyn 500 Mg  Tabs (Naproxen) .... As Needed 9)  Norvasc 5 Mg Tabs (Amlodipine Besylate) .... Take 1 Tablet By Mouth Every Morning 10)  Motrin Ib 200 Mg Tabs (Ibuprofen) .... As Needed 11)  Benzonatate 100 Mg Caps (Benzonatate) .[name].. 1 By Mouth Three Times A Day As Needed Cough  Current Medications (verified): 1)  Toprol Xl 100 Mg  Tb24 (Metoprolol Succinate) .... 1/2 Tab  Two Times A Day 2)  Pravachol 40 Mg  Tabs (Pravastatin Sodium) .... 1/2 Tab Q Hs 3)  Lasix 20 Mg  Tabs (Furosemide) .... Take 1 Tablet By Mouth Once A Day 4)  Prilosec Otc 20 Mg Tbec (Omeprazole Magnesium) .... Take 1 Tablet By Mouth Once A Day Before Meal As Needed 5)  Aspirin 81 Mg Tbec (Aspirin) .... Take 1 Tablet By Mouth Once A Day 6)  Vitamin E 400 Unit Caps (Vitamin E) .... Take One Tablet By Mouth Every Other Day 7)  Calcium Carbonate-Vitamin D 600-400 Mg-Unit  Tabs (Calcium Carbonate-Vitamin D) .... Take 1 Tablet By Mouth Once A Day  8)  Naprosyn 500 Mg  Tabs (Naproxen) .... As Needed 9)  Norvasc 5 Mg Tabs (Amlodipine Besylate) .... Take 1 Tablet By Mouth Every Morning 10)  Motrin Ib 200 Mg Tabs (Ibuprofen) .... As Needed 11)  Benzonatate 100 Mg Caps (Benzonatate) .[name].. 1 By Mouth Three Times A Day As Needed Cough  Allergies (verified): 1)  ! Pcn 2)  ! * Demorol 3)  ! Codeine 4)  ! * Tetanus-  Horse Serum  Past History:  Past Medical History: Last updated: 09/23/2009 GERD Hyperlipidemia Hypertension Peripheral neuropathy status post breast cancer, right with mastectomy and reconstruction surgery 1989, asymptomatic status post  TAH status post tonsillectomy status post lipoma right chest wall status post ovarian cyst Breast cancer, hx of  ILD --CT chest 6/10 >> mediastinal lymphadenopathy, extensive fibrosis & honeycombing BLL& pulmonary arterial dilation. 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 -7/9 >>COugh better with prednisone & tessalon.ANA, RA neg, ESR 74 PFTs 6/10 >> lung volumes preserved, DLCO 51%  Review of Systems      See HPI  Vital Signs:  Patient profile:   77 year old female Height:      63 inches Weight:      157.50 pounds BMI:     28.00 O2 Sat:      97 % on Room air Temp:     97.9 degrees F oral Pulse rate:   60 / minute BP sitting:   104 / 58  (left arm) Cuff size:   regular  Vitals Entered By: Boone Master CNA/MA (January 29, 2011 2:42 PM)  O2 Flow:  Room air CC: 6 month follow up  Is Patient Diabetic? No Comments Medications reviewed with patient Daytime contact number verified with patient. Boone Master CNA/MA  January 29, 2011 2:36 PM    Physical Exam  Additional Exam:  Gen. Pleasant, well-nourished, in no distress, normal affect ENT - no lesions, no post nasal drip Neck: No JVD, no thyromegaly, no carotid bruits Lungs: no  use of accessory muscles, no dullness to percussion, bibasal fine rales bibasliar Cardiovascular RRR 1-2/6 SM  no peripheral edema Musculoskeletal: No deformities, no cyanosis or clubbing      Impression & Recommendations:  Problem # 1:  PULMONARY FIBROSIS (ICD-515) Well compensated , good lung function. She is doing well. no chagnes in rx  Will monitor q 6 months  Other Orders: Est. Patient Level II (57846)  Patient Instructions: 1)  Saline nasal rinse as needed  2)  Claritin 10mg  at bedtime as needed drainage. 3)  follow up Dr. Vassie Loll in 6 months  and as needed

## 2011-04-28 NOTE — Assessment & Plan Note (Signed)
[facility]                         GASTROENTEROLOGY OFFICE NOTE   Amanda Long, Amanda Long                      MRN:          [id number]  DATE:12/26/2007                            DOB:          10/10/1925    REASON FOR CONSULTATION:  Screening colonoscopy.   Amanda Long is a pleasant 77 year old African American female referred  through the courtesy of Dr. Tawanna Cooler for evaluation.  She has never had a  colonoscopy and has been referred for an exam.  Except for occasional  constipation, she has no GI complaints.  Specifically, she is without  change of bowel habits, abdominal pain, melena, or hematochezia.   PAST MEDICAL HISTORY:  1. Breast cancer that was diagnosed and treated in 1985 and 1989.  2. Hypertension.  3. Status post hysterectomy.  4. Appendectomy.  5. Breast surgery.   FAMILY HISTORY:  Pertinent for a sister with uterine cancer.   MEDICATIONS:  Toprol XL, Pravachol, Lasix, Prilosec, and baby aspirin,  Naprosyn p.r.n.   ALLERGIES:  1. PENICILLIN.  2. TETANUS.  3. CODEINE.  4. DEMEROL.  5. DILAUDID.   She does not smoke.  She drinks rarely.  She is widowed and retired.   REVIEW OF SYSTEMS:  Positive for some hoarseness and coughing and joint  and back pain.   PHYSICAL EXAMINATION:  VITAL SIGNS:  Pulse 72, blood pressure 130/72,  weight 182.  HEENT: EOMI.  PERRLA.  Sclerae are anicteric.  Conjunctivae are pink.  NECK:  Supple without thyromegaly, adenopathy or carotid bruits.  CHEST:  Clear to auscultation and percussion without adventitious  sounds.  CARDIAC:  Regular rhythm; normal S1 S2.  There are no murmurs, gallops  or rubs.  ABDOMEN:  Bowel sounds are normoactive.  Abdomen is soft, nontender and  nondistended.  There are no abdominal masses, tenderness, splenic  enlargement or hepatomegaly.  EXTREMITIES:  Full range of motion.  No cyanosis, clubbing or edema.  RECTAL:  Deferred.  There are no masses.  Stool is Hemoccult  negative.   ASSESSMENT:  1. An 77 year old female for screening colonoscopy.  2. History of breast cancer.   PLAN:  I will proceed with screening colonoscopy.     Barbette Hair. Arlyce Dice, MD,FACG  Electronically Signed    RDK/MedQ  DD: 12/26/2007  DT: 12/26/2007  Job #: [id number]   cc:   Tinnie Gens A. Tawanna Cooler, MD

## 2011-05-14 ENCOUNTER — Encounter: Payer: Self-pay | Admitting: Internal Medicine

## 2011-05-14 ENCOUNTER — Ambulatory Visit (INDEPENDENT_AMBULATORY_CARE_PROVIDER_SITE_OTHER): Payer: Medicare Other | Admitting: Internal Medicine

## 2011-05-14 DIAGNOSIS — G500 Trigeminal neuralgia: Secondary | ICD-10-CM

## 2011-05-14 DIAGNOSIS — W57XXXA Bitten or stung by nonvenomous insect and other nonvenomous arthropods, initial encounter: Secondary | ICD-10-CM

## 2011-05-14 MED ORDER — TRIAMCINOLONE ACETONIDE 0.025 % EX OINT: TOPICAL_OINTMENT | Freq: Two times a day (BID) | CUTANEOUS | Status: AC

## 2011-05-14 MED ORDER — CARBAMAZEPINE ER 100 MG PO TB12: 100.0000 mg | ORAL_TABLET | Freq: Two times a day (BID) | ORAL | Status: DC

## 2011-05-14 NOTE — Assessment & Plan Note (Signed)
No current symptoms of tick born illness. Monitor area for evidence of infection. Begin triamcinolone to affected area twice a day.Followup if no improvement or worsening.

## 2011-05-14 NOTE — Assessment & Plan Note (Signed)
Resume Tegretol 100 mg twice a day. Schedule followup with PMD

## 2011-05-14 NOTE — Progress Notes (Signed)
  Subjective:    Patient ID: Amanda Long, female    DOB: 15-Dec-1924, 77 y.o.   MRN: [id number]  HPI Pt presents to clinic for evaluation of neuralgia and tick bite. States remote history of what appears to be right trigeminal neuralgia previously evaluated by neurology and controlled with Tegretol. Symptoms resolved and she stopped the medication. States it's been years since the last episode of pain. Most recently has recurrence of right facial pain without injury or trauma. Appears to be neuropathic per the description. Wishes to resume previous medication. Took a family member's Vicodin x one that made her nauseated. No alleviating or exacerbating factors. Also has only history of right medial thigh tick bite. Tick was removed. There is mild surrounding erythema without drainage. Has no arthralgias other rash fever or photophobia. No other complaints  Reviewed past medical history, medications and allergies.  Review of Systems see history of present illness     Objective:   Physical Exam  [nursing notereviewed. Constitutional: She appears well-developed and well-nourished. No distress.  HENT:  Head: Normocephalic and atraumatic.  Right Ear: Tympanic membrane, external ear and ear canal normal.  Nose: Nose normal.  Eyes: Conjunctivae are normal. No scleral icterus.  Neurological: She is alert.       No sensory deficit of face. No facial asymmetry or droop  Skin: She is not diaphoretic.       Right medial thigh reveals circular erythema. No evidence of foreign body. Minimal soft tissue swelling. Nontender. No drainage noted          Assessment & Plan:

## 2011-05-30 ENCOUNTER — Encounter: Payer: Self-pay | Admitting: *Deleted

## 2011-06-04 ENCOUNTER — Ambulatory Visit: Payer: Medicare Other | Admitting: Pulmonary Disease

## 2011-07-16 ENCOUNTER — Ambulatory Visit (INDEPENDENT_AMBULATORY_CARE_PROVIDER_SITE_OTHER)
Admission: RE | Admit: 2011-07-16 | Discharge: 2011-07-16 | Disposition: A | Discharge status: Home / Self Care | Payer: Medicare Other | Source: Ambulatory Visit | Attending: Pulmonary Disease | Admitting: Pulmonary Disease

## 2011-07-16 ENCOUNTER — Ambulatory Visit (INDEPENDENT_AMBULATORY_CARE_PROVIDER_SITE_OTHER): Payer: Medicare Other | Admitting: Pulmonary Disease

## 2011-07-16 ENCOUNTER — Encounter: Payer: Self-pay | Admitting: Pulmonary Disease

## 2011-07-16 VITALS — BP 130/80 | HR 59 | Temp 97.9°F | Ht 64.5 in | Wt 157.0 lb

## 2011-07-16 DIAGNOSIS — J8410 Pulmonary fibrosis, unspecified: Secondary | ICD-10-CM

## 2011-07-16 NOTE — Patient Instructions (Signed)
Chest xray today Check oxygen levels while walking Your lung fibrosis appears stable

## 2011-07-16 NOTE — Progress Notes (Signed)
  Subjective:    Patient ID: Amanda Long, female    DOB: 1925/06/13, 77 y.o.   MRN: [id number]  HPI 85/F, remote smoker, with ILD, preserved lung function  Presented with cough & dyspnea since 2/10  She underwent lumpectomy & LN dissection in '85 but had skin recurrence & required mastectomy with breast implant in '89 followed by chemo-RT. Her serial CXRs show a haze over the lower Rt hemithorax due to the implant which have been misinterpreted as pneumonia.  She c/o recurrent cough , a feeling of 'strangling' in her throat & progressive dyspnea on exertion. Zestril was stopped 05/02/09. Codeine syrup makes her nauseous but she can tolerate this at night. Prilosec used for GERD,  no symptoms of heartburn.  CT chest 6/10 >> mediastinal lymphadenopathy, extensive fibrosis & honeycombing BLL& pulmonary arterial dilation.  7/10 >>COugh better with prednisone & tessalon.ANA, RA neg, ESR 74  PFTs 6/10 >> lung volumes preserved, DLCO 51%     07/16/2011 CXR unchanged Did not desaturate on exertion Takes care of mom 54 yrs old !  Review of Systems Patient denies significant dyspnea,cough, hemoptysis,  chest pain, palpitations, pedal edema, orthopnea, paroxysmal nocturnal dyspnea, lightheadedness, nausea, vomiting, abdominal or  leg pains      Objective:   Physical Exam  Gen. Pleasant, well-nourished, in no distress ENT - no lesions, no post nasal drip Neck: No JVD, no thyromegaly, no carotid bruits Lungs: no use of accessory muscles, no dullness to percussion, bibasal  Rales, no rhonchi  Cardiovascular: Rhythm regular, heart sounds  normal, no murmurs or gallops, no peripheral edema Musculoskeletal: No deformities, no cyanosis or clubbing        Assessment & Plan:

## 2011-07-17 NOTE — Assessment & Plan Note (Signed)
CT chest 6/10 >> mediastinal lymphadenopathy, extensive fibrosis & honeycombing BLL& pulmonary arterial dilation.  7/10 >>COugh better with prednisone & tessalon.ANA, RA neg, ESR 74  PFTs 6/10 >> lung volumes preserved, DLCO 51%  Appears non progressive FU q 6months Discussed early  signs of bronchitis

## 2011-07-21 NOTE — Progress Notes (Signed)
Quick Note:  I informed pt of RA's findings and recommendations.   Pt verbalized understanding  ______

## 2011-08-05 ENCOUNTER — Encounter: Payer: Self-pay | Admitting: Family Medicine

## 2011-08-05 ENCOUNTER — Telehealth: Payer: Self-pay | Admitting: Family Medicine

## 2011-08-05 ENCOUNTER — Ambulatory Visit (INDEPENDENT_AMBULATORY_CARE_PROVIDER_SITE_OTHER): Payer: Medicare Other | Admitting: Family Medicine

## 2011-08-05 DIAGNOSIS — G500 Trigeminal neuralgia: Secondary | ICD-10-CM

## 2011-08-05 DIAGNOSIS — Z888 Allergy status to other drugs, medicaments and biological substances status: Secondary | ICD-10-CM

## 2011-08-05 DIAGNOSIS — G609 Hereditary and idiopathic neuropathy, unspecified: Secondary | ICD-10-CM

## 2011-08-05 DIAGNOSIS — T7840XA Allergy, unspecified, initial encounter: Secondary | ICD-10-CM | POA: Insufficient documentation

## 2011-08-05 DIAGNOSIS — R05 Cough: Secondary | ICD-10-CM

## 2011-08-05 MED ORDER — PREDNISONE 20 MG PO TABS: ORAL_TABLET | ORAL | Status: DC

## 2011-08-05 MED ORDER — OXYCODONE-ACETAMINOPHEN 7.5-500 MG PO TABS: 1.0000 | ORAL_TABLET | Freq: Four times a day (QID) | ORAL | Status: DC | PRN

## 2011-08-05 MED ORDER — CARBAMAZEPINE ER 100 MG PO TB12: ORAL_TABLET | ORAL | Status: DC

## 2011-08-05 NOTE — Patient Instructions (Signed)
Take the prednisone as directed for the skin rash and your cough.  Stop the Tessalon.  Percocet ............ One half to one tablet every 4 to 6 hours as needed for severe pain.  Call the neurology office and make an appointment to see Dr. Porfirio Mylar  M.D., as soon as possible.  Increase the Tegretol to 400 mg twice daily

## 2011-08-05 NOTE — Telephone Encounter (Signed)
Pt called and said that she contacted Dr Porfirio Mylar [place] office re: sch ov re: Neurology visit, but pt was told that she would have to have a referral from Dr Tawanna Cooler because it had been over 3 yrs since pt had been in to that office.

## 2011-08-05 NOTE — Progress Notes (Signed)
  Subjective:    Patient ID: Amanda Long, female    DOB: 09-Apr-1925, 77 y.o.   MRN: [id number]  HPI Amanda Long is a 77 year old, widowed female, nonsmoker, who comes in today for evaluation.  Three problems.  She has a history of trigeminal neuralgia.  We originally had referred her to Dr. Porfirio Mylar D. And she was on Tegretol and did well.  She stopped the Tegretol.  However, unfortunately, in June, the trigeminal neuralgia, returned.  She saw Dr. Irving Burton in here and he start her back on Tegretol 100 mg b.i.d.  It seemed to work well until the last couple weeks when the pain has gotten worse.  She describes the pain now as a 10 on a scale of one to 10 and she can't sleep at night.  For the last, week she's had a cold manifested by head congestion, my nose, and cough.  No sputum production.  No fever.  She is takes to [employer] via pulmonary and a day or two after she started the [place].  She broke out in hives.     Review of Systems General pulmonary dermatologic, neurologic, review of systems otherwise negative    Objective:   Physical Exam  Well-developed well-nourished, female in severe pain from the trigeminal neuralgia.  HEENT negative.  Neck was supple.  Lungs were clear.  No wheezing.  Examination.  Skin shows diffuse hives      Assessment & Plan:  Viral syndrome.  Trigeminal neuralgia.  Urticaria secondary to Tessalon.  Plan increase Tegretol and oxycodone.  We check with neurology.  Prednisone burst and taper for urticaria

## 2011-08-11 ENCOUNTER — Ambulatory Visit (INDEPENDENT_AMBULATORY_CARE_PROVIDER_SITE_OTHER): Payer: Medicare Other | Admitting: Family Medicine

## 2011-08-11 ENCOUNTER — Encounter: Payer: Self-pay | Admitting: Family Medicine

## 2011-08-11 DIAGNOSIS — Z888 Allergy status to other drugs, medicaments and biological substances status: Secondary | ICD-10-CM

## 2011-08-11 DIAGNOSIS — G500 Trigeminal neuralgia: Secondary | ICD-10-CM

## 2011-08-11 DIAGNOSIS — T7840XA Allergy, unspecified, initial encounter: Secondary | ICD-10-CM

## 2011-08-11 DIAGNOSIS — I10 Essential (primary) hypertension: Secondary | ICD-10-CM

## 2011-08-11 MED ORDER — CARBAMAZEPINE ER 100 MG PO TB12: ORAL_TABLET | ORAL | Status: DC

## 2011-08-11 NOTE — Patient Instructions (Signed)
Continue the Tegretol, two tabs twice daily.  Finish tapering the prednisone.  In the future.  Did not take Tessalon pearls.  Continue current medications.  Follow-up in January 2013  For your annual exam.  If you feeling well.  I would cancel the neurology appointment

## 2011-08-11 NOTE — Progress Notes (Signed)
  Subjective:    Patient ID: Amanda Long, female    DOB: [birth year], 77 y.o.   MRN: [id number]  HPI Amanda Long is an 77 year old single female, retired Engineer, civil (consulting) nonsmoker, who comes in today for follow-up of multiple issues.  When we saw her last time her blood pressure was elevated, but her trigeminal neuralgia have flared up.  BP 120/80 today.  We increased her Tegretol from 100 mg b.i.d. To 200 mg b.i.d. And her trigeminal neuralgia as quiet down.  Joint had to take two of the Percocet tablets.  She continues to taper the prednisone because of the general urticaria.  She developed from the [employer].  Advised never to take the [employer].  Again.  The tick bite on the inner side of her right thigh has healed.  No symptoms of tick fever   Review of Systems    General neurologic infectious review of systems otherwise negative Objective:   Physical Exam  Well-developed well-nourished, female, in no acute distress.  She is able to touch.  Her face without severe pain.  Examination leg shows a healing tick bite.  BP 120/80      Assessment & Plan:  Trigeminal neuralgia, much improved on increasing dose of Tegretol 200 b.i.d. Continue that dose.  Urticaria secondary to Tessalon Perles, rash now gone, taper prednisone as directed.  Hypertension good blood pressure control, 120/80

## 2011-11-19 ENCOUNTER — Other Ambulatory Visit: Payer: Self-pay | Admitting: Family Medicine

## 2011-11-19 DIAGNOSIS — Z1231 Encounter for screening mammogram for malignant neoplasm of breast: Secondary | ICD-10-CM

## 2011-11-19 DIAGNOSIS — Z9011 Acquired absence of right breast and nipple: Secondary | ICD-10-CM

## 2011-12-24 ENCOUNTER — Ambulatory Visit
Admission: RE | Admit: 2011-12-24 | Discharge: 2011-12-24 | Disposition: A | Discharge status: Home / Self Care | Payer: Medicare Other | Source: Ambulatory Visit | Attending: Family Medicine | Admitting: Family Medicine

## 2011-12-24 DIAGNOSIS — Z1231 Encounter for screening mammogram for malignant neoplasm of breast: Secondary | ICD-10-CM

## 2011-12-24 DIAGNOSIS — Z9011 Acquired absence of right breast and nipple: Secondary | ICD-10-CM

## 2012-01-04 ENCOUNTER — Encounter: Payer: Self-pay | Admitting: Family Medicine

## 2012-01-04 ENCOUNTER — Ambulatory Visit (INDEPENDENT_AMBULATORY_CARE_PROVIDER_SITE_OTHER): Payer: Medicare Other | Admitting: Family Medicine

## 2012-01-04 DIAGNOSIS — J8410 Pulmonary fibrosis, unspecified: Secondary | ICD-10-CM

## 2012-01-04 DIAGNOSIS — G500 Trigeminal neuralgia: Secondary | ICD-10-CM

## 2012-01-04 DIAGNOSIS — E785 Hyperlipidemia, unspecified: Secondary | ICD-10-CM

## 2012-01-04 DIAGNOSIS — H9190 Unspecified hearing loss, unspecified ear: Secondary | ICD-10-CM

## 2012-01-04 DIAGNOSIS — Z853 Personal history of malignant neoplasm of breast: Secondary | ICD-10-CM

## 2012-01-04 DIAGNOSIS — G609 Hereditary and idiopathic neuropathy, unspecified: Secondary | ICD-10-CM

## 2012-01-04 DIAGNOSIS — I10 Essential (primary) hypertension: Secondary | ICD-10-CM

## 2012-01-04 DIAGNOSIS — K219 Gastro-esophageal reflux disease without esophagitis: Secondary | ICD-10-CM

## 2012-01-04 LAB — HEPATIC FUNCTION PANEL
ALT: 13 U/L (ref 0–35)
AST: 13 U/L (ref 0–37)
Albumin: 3.3 g/dL — ABNORMAL LOW (ref 3.5–5.2)
Alkaline Phosphatase: 85 U/L (ref 39–117)
Bilirubin, Direct: 0.2 mg/dL (ref 0.0–0.3)
Total Bilirubin: 1 mg/dL (ref 0.3–1.2)
Total Protein: 6.4 g/dL (ref 6.0–8.3)

## 2012-01-04 LAB — POCT URINALYSIS DIPSTICK
Glucose, UA: NEGATIVE
Nitrite, UA: NEGATIVE
Urobilinogen, UA: 0.2
pH, UA: 7

## 2012-01-04 LAB — CBC WITH DIFFERENTIAL/PLATELET
Basophils Absolute: 0 K/uL (ref 0.0–0.1)
Basophils Relative: 0.4 % (ref 0.0–3.0)
Eosinophils Absolute: 0.1 K/uL (ref 0.0–0.7)
Eosinophils Relative: 1.8 % (ref 0.0–5.0)
HCT: 35.6 % — ABNORMAL LOW (ref 36.0–46.0)
Hemoglobin: 12 g/dL (ref 12.0–15.0)
Lymphocytes Relative: 19.8 % (ref 12.0–46.0)
Lymphs Abs: 1 K/uL (ref 0.7–4.0)
MCHC: 33.6 g/dL (ref 30.0–36.0)
MCV: 99.9 fl (ref 78.0–100.0)
Monocytes Absolute: 0.9 K/uL (ref 0.1–1.0)
Monocytes Relative: 17.4 % — ABNORMAL HIGH (ref 3.0–12.0)
Neutro Abs: 3.1 K/uL (ref 1.4–7.7)
Neutrophils Relative %: 60.6 % (ref 43.0–77.0)
Platelets: 167 K/uL (ref 150.0–400.0)
RBC: 3.57 Mil/uL — ABNORMAL LOW (ref 3.87–5.11)
RDW: 13.9 % (ref 11.5–14.6)
WBC: 5.1 K/uL (ref 4.5–10.5)

## 2012-01-04 LAB — BASIC METABOLIC PANEL WITH GFR
BUN: 14 mg/dL (ref 6–23)
CO2: 28 meq/L (ref 19–32)
Calcium: 8.3 mg/dL — ABNORMAL LOW (ref 8.4–10.5)
Chloride: 104 meq/L (ref 96–112)
Creatinine, Ser: 0.9 mg/dL (ref 0.4–1.2)
GFR: 66.78 mL/min
Glucose, Bld: 99 mg/dL (ref 70–99)
Potassium: 4.5 meq/L (ref 3.5–5.1)
Sodium: 139 meq/L (ref 135–145)

## 2012-01-04 LAB — TSH: TSH: 0.28 u[IU]/mL — ABNORMAL LOW (ref 0.35–5.50)

## 2012-01-04 LAB — LIPID PANEL
Cholesterol: 183 mg/dL (ref 0–200)
LDL Cholesterol: 96 mg/dL (ref 0–99)

## 2012-01-04 MED ORDER — FUROSEMIDE 20 MG PO TABS: 20.0000 mg | ORAL_TABLET | Freq: Every day | ORAL | Status: DC

## 2012-01-04 MED ORDER — METOPROLOL SUCCINATE ER 100 MG PO TB24: 100.0000 mg | ORAL_TABLET | Freq: Every day | ORAL | Status: DC

## 2012-01-04 MED ORDER — OMEPRAZOLE 20 MG PO CPDR: 20.0000 mg | DELAYED_RELEASE_CAPSULE | Freq: Every day | ORAL | Status: DC | PRN

## 2012-01-04 MED ORDER — PRAVASTATIN SODIUM 20 MG PO TABS: 20.0000 mg | ORAL_TABLET | Freq: Every day | ORAL | Status: DC

## 2012-01-04 MED ORDER — AMLODIPINE BESYLATE 5 MG PO TABS: 5.0000 mg | ORAL_TABLET | Freq: Every day | ORAL | Status: DC

## 2012-01-04 NOTE — Progress Notes (Signed)
  Subjective:    Patient ID: Amanda Long, female    DOB: 03-24-1925, 78 y.o.   MRN: [id number]  HPI  Amanda Long is a 78 year old single female, nonsmoker, who comes in today for a Medicare wellness examination because of a history of hypertension, peripheral neuropathy, unknown etiology, reflux, esophagitis, osteoarthritis, hyperlipidemia, history of breast cancer.  Her medications reviewed in detail, and there is been no changes except she has issues with the Tegretol.  She says it helps the neuropathy, but she has side effects of constipation.  I asked her to cut the dose in half and if the constipation persists.  Take some milk of Magnesia.  If however, this does not work.  I referred her back to neurology.  She gets routine eye care, she does have hearing aids, upper and lower dentures, vaccinations, seasonal flu shot 2012, Pneumovax, x 2, tetanus, 2007, information given on shingles.  She still functions independently at home.  Recent mammogram because of a history of breast cancer.  Normal, cognitive function, normal, walks daily, home health safety reviewed.  New issues identified, no guns in the house, and she does have a healthcare power of attorney and living will.   Review of Systems  Constitutional: Negative.   HENT: Negative.   Eyes: Negative.   Respiratory: Negative.   Cardiovascular: Negative.   Gastrointestinal: Negative.   Genitourinary: Negative.   Musculoskeletal: Negative.   Neurological: Negative.   Hematological: Negative.   Psychiatric/Behavioral: Negative.        Objective:   Physical Exam  Constitutional: She appears well-developed and well-nourished.  HENT:  Head: Normocephalic and atraumatic.  Right Ear: External ear normal.  Left Ear: External ear normal.  Nose: Nose normal.  Mouth/Throat: Oropharynx is clear and moist.  Eyes: EOM are normal. Pupils are equal, round, and reactive to light.  Neck: Normal range of motion. Neck supple. No thyromegaly  present.  Cardiovascular: Normal rate, regular rhythm, normal heart sounds and intact distal pulses.  Exam reveals no gallop and no friction rub.   No murmur heard. Pulmonary/Chest: Effort normal and breath sounds normal.  Abdominal: Soft. Bowel sounds are normal. She exhibits no distension and no mass. There is no tenderness. There is no rebound.  Genitourinary:       Left breast is normal.  The right breast has been reconstructed with an implant following her previous breast cancer.  Surgery.  No palpable masses  Musculoskeletal: Normal range of motion.  Lymphadenopathy:    She has no cervical adenopathy.  Neurological: She is alert. She has normal reflexes. No cranial nerve deficit. She exhibits normal muscle tone. Coordination normal.  Skin: Skin is warm and dry.  Psychiatric: She has a normal mood and affect. Her behavior is normal. Judgment and thought content normal.          Assessment & Plan:  Healthy female.  Hypertension.  Continue current medications.  Neuropathy decrease the Tegretol that twice daily.  Reflux esophagitis.  Continue Prilosec.  Hyperlipidemia.  Continue Pravachol 20 mg daily and an aspirin tablet.  History of breast cancer, currently asymptomatic.  Recent mammogram normal

## 2012-01-04 NOTE — Patient Instructions (Signed)
Continue your current medications except decrease the Tegretol to one tablet twice daily.  If these symptoms persist, then I would consult with your neurologist, to see what other options.  There are  Return in one year, sooner if any problems.  We will call you about your lab work

## 2012-01-12 ENCOUNTER — Encounter: Payer: Self-pay | Admitting: Pulmonary Disease

## 2012-01-12 ENCOUNTER — Ambulatory Visit (INDEPENDENT_AMBULATORY_CARE_PROVIDER_SITE_OTHER): Payer: Medicare Other | Admitting: Pulmonary Disease

## 2012-01-12 VITALS — BP 112/60 | HR 59 | Temp 98.4°F | Ht 64.5 in | Wt 151.8 lb

## 2012-01-12 DIAGNOSIS — J8410 Pulmonary fibrosis, unspecified: Secondary | ICD-10-CM

## 2012-01-12 NOTE — Progress Notes (Signed)
  Subjective:    Patient ID: Amanda Long, female    DOB: Jan 22, 1925, 78 y.o.   MRN: [id number]  HPI 86/F, remote smoker, with ILD, preserved lung function  Presented with cough & dyspnea since 2/10  She underwent lumpectomy & LN dissection in '85 but had skin recurrence & required mastectomy with breast implant in '89 followed by chemo-RT. Her serial CXRs show a haze over the lower Rt hemithorax due to the implant which have been misinterpreted as pneumonia.  She c/o recurrent cough , a feeling of 'strangling' in her throat & progressive dyspnea on exertion. Zestril was stopped 05/02/09. Codeine syrup makes her nauseous but she can tolerate this at night. Prilosec used for GERD, no symptoms of heartburn.  CT chest 6/10 >> mediastinal lymphadenopathy, extensive fibrosis & honeycombing BLL & pulmonary arterial dilation.  7/10 >>COugh better with prednisone & tessalon.ANA, RA neg, ESR 74  PFTs 6/10 >> lung volumes preserved, DLCO 51%    01/12/2012 CXR 8/12 unchanged  Did not desaturate on exertion  Takes care of mom 67 yrs old ! Her rt little finger PIP joint was swollen & red x 2 days - gout falre     Review of Systems Patient denies significant dyspnea,cough, hemoptysis,  chest pain, palpitations, pedal edema, orthopnea, paroxysmal nocturnal dyspnea, lightheadedness, nausea, vomiting, abdominal or  leg pains      Objective:   Physical Exam  Gen. Pleasant, well-nourished, in no distress ENT - no lesions, no post nasal drip Neck: No JVD, no thyromegaly, no carotid bruits Lungs: no use of accessory muscles, no dullness to percussion, clear without rales or rhonchi  Cardiovascular: Rhythm regular, heart sounds  normal, no murmurs or gallops, no peripheral edema Musculoskeletal: No deformities, no cyanosis or clubbing        Assessment & Plan:

## 2012-01-12 NOTE — Patient Instructions (Signed)
Take naproxen 1 tab oral thrice daily with food for gout flare -c all Dr Tawanna Cooler if worse Schedule breathing test Your fibrosis in lungs appears stable Ambulatory satn

## 2012-01-13 NOTE — Assessment & Plan Note (Signed)
Rpt PFTs to compare but she appears stable clinically Naproxen for gout flare - she will call dr todd if worse

## 2012-02-16 ENCOUNTER — Other Ambulatory Visit: Payer: Self-pay | Admitting: Family Medicine

## 2012-02-16 MED ORDER — CARBAMAZEPINE ER 100 MG PO TB12: ORAL_TABLET | ORAL | Status: DC

## 2012-02-16 NOTE — Telephone Encounter (Signed)
Pt was told last time she could decrease to 1 Tegretol 2x/day to see if it works. It didn't work, so she resumed taking 2 pills twice a day. But now, she's run out of meds due to increased dose. Please call refill in to [facility] on [address]. She has enough for tomorrow.

## 2012-02-16 NOTE — Telephone Encounter (Signed)
Rx sent 

## 2012-03-07 ENCOUNTER — Encounter: Payer: Self-pay | Admitting: Gastroenterology

## 2012-04-05 ENCOUNTER — Encounter: Payer: Self-pay | Admitting: Gastroenterology

## 2012-05-10 ENCOUNTER — Encounter: Payer: Self-pay | Admitting: Gastroenterology

## 2012-05-10 ENCOUNTER — Ambulatory Visit (INDEPENDENT_AMBULATORY_CARE_PROVIDER_SITE_OTHER): Payer: Medicare Other | Admitting: Gastroenterology

## 2012-05-10 VITALS — BP 124/68 | HR 60 | Ht 64.5 in | Wt 156.0 lb

## 2012-05-10 DIAGNOSIS — Z860100 Personal history of colon polyps, unspecified: Secondary | ICD-10-CM | POA: Insufficient documentation

## 2012-05-10 MED ORDER — PEG-KCL-NACL-NASULF-NA ASC-C 100 G PO SOLR: 1.0000 | Freq: Once | ORAL | Status: DC

## 2012-05-10 NOTE — Progress Notes (Signed)
History of Present Illness: Amanda Long is a pleasant 78 year old Afro-American female here for recall colonoscopy. In 2009 multiple adenomatous polyps were removed at screening colonoscopy. She has no GI complaints including abdominal pain, change in bowel habits melena or hematochezia.  Patient has a history breast cancer in 1985. She is a retired Char[employer] fundraiser.    Past Medical History  Diagnosis Date  . GERD (gastroesophageal reflux disease)   . Hyperlipidemia   . Hypertension   . Peripheral neuropathy   . Breast cancer     Right   . Duodenitis without mention of hemorrhage   . Unspecified gastritis and gastroduodenitis without mention of hemorrhage   . History of colon polyps   . Diverticulosis of colon (without mention of hemorrhage)   . Internal hemorrhoids without mention of complication    Past Surgical History  Procedure Date  . Total abdominal hysterectomy   . Tonsillectomy   . Mastectomy     right  . Appendectomy    family history includes Coronary artery disease in an unspecified family member; Glaucoma in an unspecified family member; and Hypertension in an unspecified family member.  There is no history of Colon cancer. Current Outpatient Prescriptions  Medication Sig Dispense Refill  . amLODipine (NORVASC) 5 MG tablet Take 1 tablet (5 mg total) by mouth daily.  100 tablet  3  . aspirin 81 MG EC tablet Take 81 mg by mouth daily.        . Calcium Carbonate-Vitamin D (CALCIUM 600+D) 600-400 MG-UNIT per tablet Take 1 tablet by mouth daily. Twice weekly      . carbamazepine (TEGRETOL XR) 100 MG 12 hr tablet 2  tablet twice daily  360 tablet  1  . Docusate Calcium (STOOL SOFTENER PO) Take by mouth as needed.      . furosemide (LASIX) 20 MG tablet Take 1 tablet (20 mg total) by mouth daily.  100 tablet  3  . ibuprofen (ADVIL,MOTRIN) 200 MG tablet Take 200 mg by mouth every 6 (six) hours as needed.        . metoprolol succinate (TOPROL-XL) 100 MG 24 hr tablet Take 50 mg by mouth  2 (two) times daily.      . naproxen (NAPROSYN) 500 MG tablet Take 500 mg by mouth as needed.        [name] omeprazole (PRILOSEC) 20 MG capsule Take 1 capsule (20 mg total) by mouth daily as needed.  100 capsule  3  . pravastatin (PRAVACHOL) 20 MG tablet Take 1 tablet (20 mg total) by mouth at bedtime.  100 tablet  3  . triamcinolone (KENALOG) 0.025 % ointment Apply topically 2 (two) times daily.  30 g  0  . vitamin E 400 UNIT capsule Take 400 Units by mouth daily. Twice a week       Allergies as of 05/10/2012 - Review Complete 05/10/2012  Allergen Reaction Noted  . Codeine  11/23/2007  . Dilaudid (hydromorphone hcl)  05/10/2012  . Meperidine hcl  11/23/2007  . Penicillins  11/23/2007    reports that she quit smoking about 28 years ago. Her smoking use included Cigarettes. She has a 10 pack-year smoking history. She has never used smokeless tobacco. She reports that she does not drink alcohol or use illicit drugs.     Review of Systems: She complains of some loss of hearing  Pertinent positive and negative review of systems were noted in the above HPI section. All other review of systems were otherwise negative.  Vital  signs were reviewed in today's medical record Physical Exam: General: Well developed , well nourished, no acute distress Head: Normocephalic and atraumatic Eyes:  sclerae anicteric, EOMI Ears: Normal auditory acuity Mouth: No deformity or lesions Neck: Supple, no masses or thyromegaly Lungs: Clear throughout to auscultation Heart: Regular rate and rhythm; no murmurs, rubs or bruits Abdomen: Soft, non tender and non distended. No masses, hepatosplenomegaly or hernias noted. Normal Bowel sounds Rectal:deferred Musculoskeletal: Symmetrical with no gross deformities  Skin: No lesions on visible extremities Pulses:  Normal pulses noted Extremities: No clubbing, cyanosis, edema or deformities noted Neurological: Alert oriented x 4, grossly nonfocal Cervical Nodes:  No  significant cervical adenopathy Inguinal Nodes: No significant inguinal adenopathy Psychological:  Alert and cooperative. Normal mood and affect

## 2012-05-10 NOTE — Patient Instructions (Signed)
Colonoscopy A colonoscopy is an exam to evaluate your entire colon. In this exam, your colon is cleansed. A long fiberoptic tube is inserted through your rectum and into your colon. The fiberoptic scope (endoscope) is a long bundle of enclosed and very flexible fibers. These fibers transmit light to the area examined and send images from that area to your caregiver. Discomfort is usually minimal. You may be given a drug to help you sleep (sedative) during or prior to the procedure. This exam helps to detect lumps (tumors), polyps, inflammation, and areas of bleeding. Your caregiver may also take a small piece of tissue (biopsy) that will be examined under a microscope. LET YOUR CAREGIVER KNOW ABOUT:   Allergies to food or medicine.   Medicines taken, including vitamins, herbs, eyedrops, over-the-counter medicines, and creams.   Use of steroids (by mouth or creams).   Previous problems with anesthetics or numbing medicines.   History of bleeding problems or blood clots.   Previous surgery.   Other health problems, including diabetes and kidney problems.   Possibility of pregnancy, if this applies.  BEFORE THE PROCEDURE   A clear liquid diet may be required for 2 days before the exam.   Ask your caregiver about changing or stopping your regular medications.   Liquid injections (enemas) or laxatives may be required.   A large amount of electrolyte solution may be given to you to drink over a short period of time. This solution is used to clean out your colon.   You should be present 60 minutes prior to your procedure or as directed by your caregiver.  AFTER THE PROCEDURE   If you received a sedative or pain relieving medication, you will need to arrange for someone to drive you home.   Occasionally, there is a little blood passed with the first bowel movement. Do not be concerned.  FINDING OUT THE RESULTS OF YOUR TEST Not all test results are available during your visit. If your test  results are not back during the visit, make an appointment with your caregiver to find out the results. Do not assume everything is normal if you have not heard from your caregiver or the medical facility. It is important for you to follow up on all of your test results. HOME CARE INSTRUCTIONS   It is not unusual to pass moderate amounts of gas and experience mild abdominal cramping following the procedure. This is due to air being used to inflate your colon during the exam. Walking or a warm pack on your belly (abdomen) may help.   You may resume all normal meals and activities after sedatives and medicines have worn off.   Only take over-the-counter or prescription medicines for pain, discomfort, or fever as directed by your caregiver. Do not use aspirin or blood thinners if a biopsy was taken. Consult your caregiver for medicine usage if biopsies were taken.  SEEK IMMEDIATE MEDICAL CARE IF:   You have a fever.   You pass large blood clots or fill a toilet with blood following the procedure. This may also occur 10 to 14 days following the procedure. This is more likely if a biopsy was taken.   You develop abdominal pain that keeps getting worse and cannot be relieved with medicine.  Document Released: 11/27/2000 Document Revised: 11/19/2011 Document Reviewed: 07/12/2008 [employer] Patient Information 2012 [employer]. 

## 2012-05-10 NOTE — Assessment & Plan Note (Signed)
In view of the patient's excellent functional status she'll be scheduled for followup colonoscopy

## 2012-05-24 ENCOUNTER — Encounter: Payer: Medicare Other | Admitting: Gastroenterology

## 2012-06-02 ENCOUNTER — Ambulatory Visit (AMBULATORY_SURGERY_CENTER): Payer: Medicare Other | Admitting: Gastroenterology

## 2012-06-02 ENCOUNTER — Encounter: Payer: Self-pay | Admitting: Gastroenterology

## 2012-06-02 VITALS — BP 145/69 | HR 42 | Temp 97.8°F | Resp 18 | Ht 64.5 in | Wt 156.0 lb

## 2012-06-02 DIAGNOSIS — Z860100 Personal history of colon polyps, unspecified: Secondary | ICD-10-CM

## 2012-06-02 DIAGNOSIS — K5730 Diverticulosis of large intestine without perforation or abscess without bleeding: Secondary | ICD-10-CM

## 2012-06-02 MED ORDER — SODIUM CHLORIDE 0.9 % IV SOLN: 500.0000 mL | INTRAVENOUS | Status: DC

## 2012-06-02 NOTE — Patient Instructions (Addendum)
Discharge instructions given with verbal understanding. Handouts on diverticulosis and a high fiber diet given. Resume previous medications.YOU HAD AN ENDOSCOPIC PROCEDURE TODAY AT THE [facility]: Refer to the procedure report that was given to you for any specific questions about what was found during the examination.  If the procedure report does not answer your questions, please call your gastroenterologist to clarify.  If you requested that your care partner not be given the details of your procedure findings, then the procedure report has been included in a sealed envelope for you to review at your convenience later.  YOU SHOULD EXPECT: Some feelings of bloating in the abdomen. Passage of more gas than usual.  Walking can help get rid of the air that was put into your GI tract during the procedure and reduce the bloating. If you had a lower endoscopy (such as a colonoscopy or flexible sigmoidoscopy) you may notice spotting of blood in your stool or on the toilet paper. If you underwent a bowel prep for your procedure, then you may not have a normal bowel movement for a few days.  DIET: Your first meal following the procedure should be a light meal and then it is ok to progress to your normal diet.  A half-sandwich or bowl of soup is an example of a good first meal.  Heavy or fried foods are harder to digest and may make you feel nauseous or bloated.  Likewise meals heavy in dairy and vegetables can cause extra gas to form and this can also increase the bloating.  Drink plenty of fluids but you should avoid alcoholic beverages for 24 hours.  ACTIVITY: Your care partner should take you home directly after the procedure.  You should plan to take it easy, moving slowly for the rest of the day.  You can resume normal activity the day after the procedure however you should NOT DRIVE or use heavy machinery for 24 hours (because of the sedation medicines used during the test).    SYMPTOMS TO  REPORT IMMEDIATELY: A gastroenterologist can be reached at any hour.  During normal business hours, 8:30 AM to 5:00 PM Monday through Friday, call [phone].  After hours and on weekends, please call the GI answering service at [phone] who will take a message and have the physician on call contact you.   Following lower endoscopy (colonoscopy or flexible sigmoidoscopy):  Excessive amounts of blood in the stool  Significant tenderness or worsening of abdominal pains  Swelling of the abdomen that is new, acute  Fever of 100F or higher  FOLLOW UP: If any biopsies were taken you will be contacted by phone or by letter within the next 1-3 weeks.  Call your gastroenterologist if you have not heard about the biopsies in 3 weeks.  Our staff will call the home number listed on your records the next business day following your procedure to check on you and address any questions or concerns that you may have at that time regarding the information given to you following your procedure. This is a courtesy call and so if there is no answer at the home number and we have not heard from you through the emergency physician on call, we will assume that you have returned to your regular daily activities without incident.  SIGNATURES/CONFIDENTIALITY: You and/or your care partner have signed paperwork which will be entered into your electronic medical record.  These signatures attest to the fact that that the information above on your   After Visit Summary has been reviewed and is understood.  Full responsibility of the confidentiality of this discharge information lies with you and/or your care-partner.   

## 2012-06-02 NOTE — Op Note (Signed)
[facility] [address] [place], [place]  [address]  COLONOSCOPY PROCEDURE REPORT  PATIENT:  Amanda Long, Amanda Long  MR#:  [id number] BIRTHDATE:  01/09/1925, 86 yrs. old  GENDER:  female ENDOSCOPIST:  Barbette Hair. Arlyce Dice, MD REF. BY: PROCEDURE DATE:  06/02/2012 PROCEDURE:  Diagnostic Colonoscopy ASA CLASS:  Class II INDICATIONS:  Screening, history of pre-cancerous (adenomatous) colon polyps Multiple polyps removed 2009 MEDICATIONS:   MAC sedation, administered by CRNA propofol 200mg IV  DESCRIPTION OF PROCEDURE:   After the risks benefits and alternatives of the procedure were thoroughly explained, informed consent was obtained.  Digital rectal exam was performed and revealed no abnormalities.   The LB [id number] endoscope was introduced through the anus and advanced to the cecum, which was identified by both the appendix and ileocecal valve, without limitations.  The quality of the prep was excellent, using MoviPrep.  The instrument was then slowly withdrawn as the colon was fully examined. <<PROCEDUREIMAGES>>  FINDINGS:  Mild diverticulosis was found in the sigmoid colon (see image2).  This was otherwise a normal examination of the colon (see image1 and image3).   Retroflexed views in the rectum revealed no abnormalities.    The time to cecum =  1) 7.25 minutes. The scope was then withdrawn in  1) 6.0  minutes from the cecum and the procedure completed. COMPLICATIONS:  None ENDOSCOPIC IMPRESSION: 1) Mild diverticulosis in the sigmoid colon 2) Otherwise normal examination RECOMMENDATIONS: 1) Return to the care of your primary provider. GI follow up as needed REPEAT EXAM:  No routine colonoscopy because of age  ______________________________ Barbette Hair. Arlyce Dice, MD  CC:  Roderick Pee, MD  n. Rosalie Doctor:   Barbette Hair. Jaydeen Odor at 06/02/2012 04:26 PM  Laurita Quint, [phone]

## 2012-06-02 NOTE — Progress Notes (Signed)
Patient did not experience any of the following events: a burn prior to discharge; a fall within the facility; wrong site/side/patient/procedure/implant event; or a hospital transfer or hospital admission upon discharge from the facility. (G8907) Patient did not have preoperative order for IV antibiotic SSI prophylaxis. (G8918)  

## 2012-06-03 ENCOUNTER — Telehealth: Payer: Self-pay | Admitting: *Deleted

## 2012-06-03 NOTE — Telephone Encounter (Signed)
  Follow up Call-  Call back number 06/02/2012  Post procedure Call Back phone  # [phone]  Permission to leave phone message Yes     Patient questions:  Do you have a fever, pain , or abdominal swelling? no Pain Score  0 *  Have you tolerated food without any problems? Yes   Have you been able to return to your normal activities? yes  Do you have any questions about your discharge instructions: Diet   no Medications  no Follow up visit  no  Do you have questions or concerns about your Care? no  Actions: * If pain score is 4 or above: No action needed, pain <4.

## 2012-07-20 ENCOUNTER — Encounter: Payer: Self-pay | Admitting: Pulmonary Disease

## 2012-07-20 ENCOUNTER — Ambulatory Visit (INDEPENDENT_AMBULATORY_CARE_PROVIDER_SITE_OTHER): Payer: Medicare Other | Admitting: Pulmonary Disease

## 2012-07-20 VITALS — BP 122/66 | HR 42 | Temp 97.8°F | Ht 65.0 in | Wt 150.0 lb

## 2012-07-20 DIAGNOSIS — J8410 Pulmonary fibrosis, unspecified: Secondary | ICD-10-CM

## 2012-07-20 LAB — PULMONARY FUNCTION TEST

## 2012-07-20 NOTE — Progress Notes (Signed)
  Subjective:    Patient ID: Amanda Long, female    DOB: May 10, 1925, 78 y.o.   MRN: [id number]  HPI 86/F, remote smoker, with ILD, preserved lung function  Presented with cough & dyspnea since 2/10  She underwent lumpectomy & LN dissection in '85 but had skin recurrence & required mastectomy with breast implant in '89 followed by chemo-RT. Her serial CXRs show a haze over the lower Rt hemithorax due to the implant which have been misinterpreted as pneumonia.  She c/o recurrent cough , a feeling of 'strangling' in her throat & progressive dyspnea on exertion. Zestril was stopped 05/02/09. Codeine syrup makes her nauseous but she can tolerate this at night. Prilosec used for GERD, no symptoms of heartburn.  CT chest 6/10 >> mediastinal lymphadenopathy, extensive fibrosis & honeycombing BLL & pulmonary arterial dilation.  7/10 >>COugh better with prednisone & tessalon.ANA, RA neg, ESR 74      07/20/2012 Doing well no complaints, cough better PFTs 8/13 >> lung volumes preserved, DLCO 47% stable compared to 6//10 No recurrence of gout Takes tegretol for neuralgia CXR 8/12 unchanged  Did not desaturate on exertion  Takes care of mom 24 yrs old !      Review of Systems neg for any significant sore throat, dysphagia, itching, sneezing, nasal congestion or excess/ purulent secretions, fever, chills, sweats, unintended wt loss, pleuritic or exertional cp, hempoptysis, orthopnea pnd or change in chronic leg swelling. Also denies presyncope, palpitations, heartburn, abdominal pain, nausea, vomiting, diarrhea or change in bowel or urinary habits, dysuria,hematuria, rash, arthralgias, visual complaints, headache, numbness weakness or ataxia.     Objective:   Physical Exam  Gen. Pleasant, well-nourished, in no distress ENT - no lesions, no post nasal drip Neck: No JVD, no thyromegaly, no carotid bruits Lungs: no use of accessory muscles, no dullness to percussion, bibasal rales, no rhonchi    Cardiovascular: Rhythm regular, heart sounds  normal, no murmurs or gallops, no peripheral edema Musculoskeletal: No deformities, no cyanosis or clubbing        Assessment & Plan:

## 2012-07-20 NOTE — Patient Instructions (Signed)
Your lung function is stable

## 2012-07-20 NOTE — Assessment & Plan Note (Signed)
PFTs 8/13 >> lung volumes preserved, DLCO 47% stable compared to 6//10 Stable ILD , follow every 81m with exertional satn

## 2012-07-20 NOTE — Progress Notes (Signed)
PFT done today. 

## 2012-07-26 ENCOUNTER — Encounter: Payer: Self-pay | Admitting: Pulmonary Disease

## 2012-08-16 ENCOUNTER — Other Ambulatory Visit: Payer: Self-pay | Admitting: *Deleted

## 2012-08-16 MED ORDER — CARBAMAZEPINE ER 100 MG PO TB12: ORAL_TABLET | ORAL | Status: DC

## 2012-12-13 ENCOUNTER — Other Ambulatory Visit: Payer: Self-pay | Admitting: Family Medicine

## 2012-12-13 DIAGNOSIS — Z1231 Encounter for screening mammogram for malignant neoplasm of breast: Secondary | ICD-10-CM

## 2013-01-03 ENCOUNTER — Ambulatory Visit
Admission: RE | Admit: 2013-01-03 | Discharge: 2013-01-03 | Disposition: A | Discharge status: Home / Self Care | Payer: Medicare Other | Source: Ambulatory Visit | Attending: Family Medicine | Admitting: Family Medicine

## 2013-01-03 DIAGNOSIS — Z1231 Encounter for screening mammogram for malignant neoplasm of breast: Secondary | ICD-10-CM

## 2013-01-05 ENCOUNTER — Encounter: Payer: Medicare Other | Admitting: Family Medicine

## 2013-01-18 ENCOUNTER — Encounter: Payer: Self-pay | Admitting: Adult Health

## 2013-01-18 ENCOUNTER — Ambulatory Visit (INDEPENDENT_AMBULATORY_CARE_PROVIDER_SITE_OTHER): Payer: Medicare Other | Admitting: Adult Health

## 2013-01-18 ENCOUNTER — Ambulatory Visit (INDEPENDENT_AMBULATORY_CARE_PROVIDER_SITE_OTHER)
Admission: RE | Admit: 2013-01-18 | Discharge: 2013-01-18 | Disposition: A | Discharge status: Home / Self Care | Payer: Medicare Other | Source: Ambulatory Visit | Attending: Adult Health | Admitting: Adult Health

## 2013-01-18 VITALS — BP 140/74 | HR 54 | Temp 97.4°F | Ht 60.0 in | Wt 153.6 lb

## 2013-01-18 DIAGNOSIS — J8410 Pulmonary fibrosis, unspecified: Secondary | ICD-10-CM

## 2013-01-18 NOTE — Addendum Note (Signed)
Addended by: Marcellus Scott on: 01/18/2013 10:56 AM   Modules accepted: Orders

## 2013-01-18 NOTE — Patient Instructions (Addendum)
Keep up the good work. We are very proud of you Followup in 6 months and as needed. I will call you with chest x-ray results tomorrow

## 2013-01-18 NOTE — Progress Notes (Signed)
  Subjective:    Patient ID: Amanda Long, female    DOB: [birth year], 79 y.o.   MRN: [id number]  HPI  86/F, remote smoker, with ILD, preserved lung function  Presented with cough & dyspnea since 2/10  She underwent lumpectomy & LN dissection in '85 but had skin recurrence & required mastectomy with breast implant in '89 followed by chemo-RT. Her serial CXRs show a haze over the lower Rt hemithorax due to the implant which have been misinterpreted as pneumonia.  She c/o recurrent cough , a feeling of 'strangling' in her throat & progressive dyspnea on exertion. Zestril was stopped 05/02/09. Codeine syrup makes her nauseous but she can tolerate this at night. Prilosec used for GERD, no symptoms of heartburn.  CT chest 6/10 >> mediastinal lymphadenopathy, extensive fibrosis & honeycombing BLL & pulmonary arterial dilation.  7/10 >>COugh better with prednisone & tessalon.ANA, RA neg, ESR 74      07/20/12  Doing well no complaints, cough better PFTs 8/13 >> lung volumes preserved, DLCO 47% stable compared to 6//10 No recurrence of gout Takes tegretol for neuralgia CXR 8/12 unchanged  Did not desaturate on exertion  Takes care of mom 41 yrs old !   01/18/2013 six-month followup for pulmonary fibrosis. 6 month follow up pulm fibrosis - reports breathing is doing well.  no new complaints Since last visit. Patient reports that her breathing has been unchanged. She denies any flare of cough, wheezing. She denies any hospitalizations or emergency room visits. Says that she continues to care for her mother turning 72, and April of this year. Oxygenation today was 100% after walking from lobby to exam room. Pnuemovax and Flu shot is up-to-date.      Review of Systems  neg for any significant sore throat, dysphagia, itching, sneezing, nasal congestion or excess/ purulent secretions, fever, chills, sweats, unintended wt loss, pleuritic or exertional cp, hempoptysis, orthopnea pnd or change in  chronic leg swelling. Also denies presyncope, palpitations, heartburn, abdominal pain, nausea, vomiting, diarrhea or change in bowel or urinary habits, dysuria,hematuria, rash, arthralgias, visual complaints, headache, numbness weakness or ataxia.     Objective:   Physical Exam   Gen. Pleasant, well-nourished, in no distress ENT - no lesions, no post nasal drip Neck: No JVD, no thyromegaly, no carotid bruits Lungs: no use of accessory muscles, no dullness to percussion, bibasal rales, no rhonchi  Cardiovascular: Rhythm regular, heart sounds  normal, no murmurs or gallops, no peripheral edema Musculoskeletal: No deformities, no cyanosis or clubbing        Assessment & Plan:

## 2013-01-18 NOTE — Assessment & Plan Note (Signed)
Pulmonary fibrosis. Patient is doing exceptionally well with normal oxygen saturations. We'll check chest x-ray today. Last chest x-ray in 2012 is unchanged. Flu shot and Pneumovax are up-to-date. Patient will return in 6 months and as needed.

## 2013-01-20 NOTE — Progress Notes (Signed)
Quick Note:  LM w/ family member TCB x1 ______ 

## 2013-01-23 ENCOUNTER — Telehealth: Payer: Self-pay | Admitting: Pulmonary Disease

## 2013-01-23 NOTE — Telephone Encounter (Signed)
Notes Recorded by Julio Sicks, NP on 01/20/2013 at 1:13 PM Mild increase scarring in bases of lungs.  No change clinically  Cont w/ ov recs  follow up as planned and As needed   Spoke with pt and notified of recs per TP She verbalized understanding and states nothing further needed

## 2013-01-25 NOTE — Progress Notes (Signed)
Quick Note:  Pt aware per 2.10.14 phone note. ______

## 2013-01-30 ENCOUNTER — Ambulatory Visit (INDEPENDENT_AMBULATORY_CARE_PROVIDER_SITE_OTHER): Payer: Medicare Other | Admitting: Family Medicine

## 2013-01-30 ENCOUNTER — Encounter: Payer: Self-pay | Admitting: Family Medicine

## 2013-01-30 VITALS — BP 130/80 | Temp 98.0°F | Ht 63.75 in | Wt 153.0 lb

## 2013-01-30 DIAGNOSIS — G500 Trigeminal neuralgia: Secondary | ICD-10-CM

## 2013-01-30 DIAGNOSIS — I10 Essential (primary) hypertension: Secondary | ICD-10-CM

## 2013-01-30 DIAGNOSIS — Z0000 Encounter for general adult medical examination without abnormal findings: Secondary | ICD-10-CM

## 2013-01-30 DIAGNOSIS — E785 Hyperlipidemia, unspecified: Secondary | ICD-10-CM

## 2013-01-30 DIAGNOSIS — K219 Gastro-esophageal reflux disease without esophagitis: Secondary | ICD-10-CM

## 2013-01-30 DIAGNOSIS — J8410 Pulmonary fibrosis, unspecified: Secondary | ICD-10-CM

## 2013-01-30 DIAGNOSIS — Z853 Personal history of malignant neoplasm of breast: Secondary | ICD-10-CM

## 2013-01-30 DIAGNOSIS — G609 Hereditary and idiopathic neuropathy, unspecified: Secondary | ICD-10-CM

## 2013-01-30 DIAGNOSIS — H9190 Unspecified hearing loss, unspecified ear: Secondary | ICD-10-CM

## 2013-01-30 LAB — POCT URINALYSIS DIPSTICK
Bilirubin, UA: NEGATIVE
Glucose, UA: NEGATIVE
Ketones, UA: NEGATIVE
Nitrite, UA: NEGATIVE
pH, UA: 7.5

## 2013-01-30 LAB — LDL CHOLESTEROL, DIRECT: Direct LDL: 107 mg/dL

## 2013-01-30 LAB — BASIC METABOLIC PANEL
BUN: 16 mg/dL (ref 6–23)
Chloride: 104 mEq/L (ref 96–112)
GFR: 60.86 mL/min (ref >60.00)
Potassium: 5.1 mEq/L (ref 3.5–5.1)

## 2013-01-30 LAB — CBC WITH DIFFERENTIAL/PLATELET
Basophils Absolute: 0 10*3/uL (ref 0.0–0.1)
Eosinophils Relative: 6.8 % — ABNORMAL HIGH (ref 0.0–5.0)
HCT: 37.8 % (ref 36.0–46.0)
Lymphocytes Relative: 38.3 % (ref 12.0–46.0)
Monocytes Relative: 12.9 % — ABNORMAL HIGH (ref 3.0–12.0)
Neutrophils Relative %: 41.1 % — ABNORMAL LOW (ref 43.0–77.0)
Platelets: 164 10*3/uL (ref 150.0–400.0)
RDW: 14.7 % — ABNORMAL HIGH (ref 11.5–14.6)
WBC: 3.5 10*3/uL — ABNORMAL LOW (ref 4.5–10.5)

## 2013-01-30 LAB — HEPATIC FUNCTION PANEL
ALT: 12 U/L (ref 0–35)
AST: 16 U/L (ref 0–37)
Albumin: 3.8 g/dL (ref 3.5–5.2)
Alkaline Phosphatase: 112 U/L (ref 39–117)
Total Protein: 6.7 g/dL (ref 6.0–8.3)

## 2013-01-30 LAB — LIPID PANEL
Cholesterol: 218 mg/dL — ABNORMAL HIGH (ref 0–200)
Total CHOL/HDL Ratio: 2
Triglycerides: 68 mg/dL (ref 0.0–149.0)

## 2013-01-30 LAB — TSH: TSH: 0.48 u[IU]/mL (ref 0.35–5.50)

## 2013-01-30 MED ORDER — CARBAMAZEPINE ER 100 MG PO TB12: ORAL_TABLET | ORAL | Status: DC

## 2013-01-30 MED ORDER — METOPROLOL SUCCINATE ER 100 MG PO TB24: ORAL_TABLET | ORAL | Status: DC

## 2013-01-30 MED ORDER — OMEPRAZOLE 20 MG PO CPDR: 20.0000 mg | DELAYED_RELEASE_CAPSULE | Freq: Every day | ORAL | Status: DC | PRN

## 2013-01-30 MED ORDER — AMLODIPINE BESYLATE 5 MG PO TABS: 5.0000 mg | ORAL_TABLET | Freq: Every day | ORAL | Status: DC

## 2013-01-30 MED ORDER — FUROSEMIDE 20 MG PO TABS: 20.0000 mg | ORAL_TABLET | Freq: Every day | ORAL | Status: DC

## 2013-01-30 MED ORDER — PRAVASTATIN SODIUM 20 MG PO TABS: 20.0000 mg | ORAL_TABLET | Freq: Every day | ORAL | Status: DC

## 2013-01-30 NOTE — Progress Notes (Signed)
  Subjective:    Patient ID: Amanda Long, female    DOB: August 25, 1925, 79 y.o.   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  She gets routine eye care, regular dental care, and you mammography and followup on her right breast cancer. She recently had a mammogram and chest x-ray which are unchanged. Vaccinations up-to-date  Cognitive function normal she walks when she can home health safety reviewed no issues identified, no guns in the house, she does have a health care power of attorney and living well  She does have decreased hearing both ears referred for not a gram at [facility]     Review of Systems  Constitutional: Negative.   HENT: Negative.   Eyes: Negative.   Respiratory: Negative.   Cardiovascular: Negative.   Gastrointestinal: Negative.   Genitourinary: Negative.   Musculoskeletal: Negative.   Neurological: Negative.   Psychiatric/Behavioral: Negative.        Objective:   Physical Exam  Constitutional: She appears well-developed and well-nourished.  HENT:  Head: Normocephalic and atraumatic.  Right Ear: External ear normal.  Left Ear: External ear normal.  Nose: Nose normal.  Mouth/Throat: Oropharynx is clear and moist.  Eyes: EOM are normal. Pupils are equal, round, and reactive to light.  Neck: Normal range of motion. Neck supple. No thyromegaly present.  Cardiovascular: Normal rate, regular rhythm, normal heart sounds and intact distal pulses.  Exam reveals no gallop and no friction rub.   No murmur heard. No carotid or aortic bruits peripheral pulses 2+ and symmetrical  Pulmonary/Chest: Effort normal and  breath sounds normal.  Abdominal: Soft. Bowel sounds are normal. She exhibits no distension and no mass. There is no tenderness. There is no rebound.  Genitourinary:  Left breast normal except for scars from previous surgery, right breast reconstructed, no palpable masses in either breast  Musculoskeletal: Normal range of motion.  Lymphadenopathy:    She has no cervical adenopathy.  Neurological: She is alert. She has normal reflexes. No cranial nerve deficit. She exhibits normal muscle tone. Coordination normal.  Skin: Skin is warm and dry.  Psychiatric: She has a normal mood and affect. Her behavior is normal. Judgment and thought content normal.          Assessment & Plan:  Healthy female  Hypertension at goal continue current therapy  History of neuropathy continue Tegretol  Reflux esophagitis continue Prilosec 20 mg daily  Hyperlipidemia continue Pravachol 20 mg daily and an aspirin tablet  Status post right breast cancer continue yearly followup in oncology  Hearing loss referred to [facility]

## 2013-01-30 NOTE — Patient Instructions (Addendum)
Continue current medications  Followup in 1 year sooner if any problems  I would recommend [facility] for hearing aid evaluation

## 2013-02-10 ENCOUNTER — Telehealth: Payer: Self-pay | Admitting: Pulmonary Disease

## 2013-02-10 MED ORDER — AZITHROMYCIN 250 MG PO TABS: ORAL_TABLET | ORAL | Status: DC

## 2013-02-10 NOTE — Telephone Encounter (Signed)
I spoke with pt and she c/o dry cough, wheezing, chest tx at top of chest, nasal congestion, sweats x yesterday. No increase SOB, f,c. She has not taking anything OTC,. Requesting to have something called in. Please advise RA thanks [facility] Allergies  Allergen Reactions  . Codeine     REACTION: itching/vomiting  . Dilaudid (Hydromorphone Hcl)   . Meperidine Hcl     REACTION: resp. distress  . Penicillins     REACTION: hives

## 2013-02-10 NOTE — Telephone Encounter (Signed)
I spoke with pt and aware of RA recs. Nothing further was needed

## 2013-02-10 NOTE — Telephone Encounter (Signed)
zpak mucinex 600 bid  

## 2013-09-05 ENCOUNTER — Ambulatory Visit (INDEPENDENT_AMBULATORY_CARE_PROVIDER_SITE_OTHER): Payer: Medicare Other | Admitting: Pulmonary Disease

## 2013-09-05 ENCOUNTER — Encounter: Payer: Self-pay | Admitting: Pulmonary Disease

## 2013-09-05 VITALS — BP 144/70 | HR 53 | Temp 98.3°F | Ht 64.5 in | Wt 150.0 lb

## 2013-09-05 DIAGNOSIS — Z23 Encounter for immunization: Secondary | ICD-10-CM

## 2013-09-05 DIAGNOSIS — J8410 Pulmonary fibrosis, unspecified: Secondary | ICD-10-CM

## 2013-09-05 NOTE — Assessment & Plan Note (Signed)
Flu shot Ambulatory satn Seemingly stable -schedule Breathing test to assess

## 2013-09-05 NOTE — Patient Instructions (Addendum)
Flu shot Ambulatory satn Schedule Breathing test

## 2013-09-05 NOTE — Progress Notes (Signed)
  Subjective:    Patient ID: Amanda Long, female    DOB: [birth year], 79 y.o.   MRN: [id number]  HPI  87/F, remote smoker, with ILD, preserved lung function  Presented with cough & dyspnea since 2/10  She underwent lumpectomy & LN dissection in '85 but had skin recurrence & required mastectomy with breast implant in '89 followed by chemo-RT. Her serial CXRs show a haze over the lower Rt hemithorax due to the implant which have been misinterpreted as pneumonia.  Zestril was stopped 05/02/09. Codeine syrup makes her nauseous but she can tolerate this at night. Prilosec used for GERD, no symptoms of heartburn. Takes tegretol for neuralgia  CT chest 6/10 >> mediastinal lymphadenopathy, extensive fibrosis & honeycombing BLL & pulmonary arterial dilation.  7/10 >>COugh better with prednisone & tessalon.ANA, RA neg, ESR 74  PFTs 8/13 >> lung volumes preserved, DLCO 47% stable compared to 6//10     09/05/2013  6 month follow up pulm fibrosis - reports breathing is doing well. no new complaints  Since last visit. Patient reports that her breathing has been unchanged. She denies any flare of cough, wheezing. She denies any hospitalizations or emergency room visits. Takes care of mom 60 yrs old !   Does not desaturate on walking   Review of Systems neg for any significant sore throat, dysphagia, itching, sneezing, nasal congestion or excess/ purulent secretions, fever, chills, sweats, unintended wt loss, pleuritic or exertional cp, hempoptysis, orthopnea pnd or change in chronic leg swelling. Also denies presyncope, palpitations, heartburn, abdominal pain, nausea, vomiting, diarrhea or change in bowel or urinary habits, dysuria,hematuria, rash, arthralgias, visual complaints, headache, numbness weakness or ataxia.     Objective:   Physical Exam  Gen. Pleasant, well-nourished, in no distress ENT - no lesions, no post nasal drip Neck: No JVD, no thyromegaly, no carotid bruits Lungs: no use of  accessory muscles, no dullness to percussion,bibasal rales , no rhonchi  Cardiovascular: Rhythm regular, heart sounds  normal, no murmurs or gallops, no peripheral edema Musculoskeletal: No deformities, no cyanosis or clubbing        Assessment & Plan:

## 2013-10-13 ENCOUNTER — Telehealth: Payer: Self-pay | Admitting: Pulmonary Disease

## 2013-10-13 NOTE — Telephone Encounter (Signed)
I spoke with pt and is aware of recs. Nothing further needed 

## 2013-10-13 NOTE — Telephone Encounter (Signed)
ATC pt line has fast busy signal x 4 wcb 

## 2013-10-13 NOTE — Telephone Encounter (Signed)
LAst OV 09-05-13. I spoke with the pt and she is c/o having increased SOB, chest tightness and dry cough x 3 days. She denies any chest congestion, fever, wheezing. She states she has been using OTC cough drops without relief. Please advise. Carron Curie, CMA Allergies  Allergen Reactions  . Codeine     REACTION: itching/vomiting  . Dilaudid [Hydromorphone Hcl]   . Meperidine Hcl     REACTION: resp. distress  . Penicillins     REACTION: hives

## 2013-10-13 NOTE — Telephone Encounter (Signed)
mucinex DM - 600 bid Call for abx if sputum/ changes color Call if no better

## 2013-10-20 ENCOUNTER — Encounter: Payer: Self-pay | Admitting: Pulmonary Disease

## 2013-10-20 ENCOUNTER — Encounter (INDEPENDENT_AMBULATORY_CARE_PROVIDER_SITE_OTHER): Payer: Self-pay

## 2013-10-20 ENCOUNTER — Telehealth: Payer: Self-pay | Admitting: Pulmonary Disease

## 2013-10-20 ENCOUNTER — Ambulatory Visit (INDEPENDENT_AMBULATORY_CARE_PROVIDER_SITE_OTHER): Payer: Medicare Other | Admitting: Pulmonary Disease

## 2013-10-20 VITALS — BP 112/62 | HR 62 | Temp 97.9°F | Ht 64.0 in | Wt 148.0 lb

## 2013-10-20 DIAGNOSIS — J8410 Pulmonary fibrosis, unspecified: Secondary | ICD-10-CM

## 2013-10-20 DIAGNOSIS — R05 Cough: Secondary | ICD-10-CM

## 2013-10-20 MED ORDER — PREDNISONE 10 MG PO TABS: ORAL_TABLET | ORAL | Status: DC

## 2013-10-20 MED ORDER — HYDROCODONE-HOMATROPINE 5-1.5 MG/5ML PO SYRP: 5.0000 mL | ORAL_SOLUTION | Freq: Four times a day (QID) | ORAL | Status: DC | PRN

## 2013-10-20 NOTE — Progress Notes (Signed)
  Subjective:    Patient ID: Amanda Long, female    DOB: [birth year], 79 y.o.   MRN: [id number]  HPI Patient comes in today for an acute sick visit.  She has known pulmonary fibrosis, and gives a few week history of worsening dry hacking cough.  She is not producing any mucus, and has not seen any purulence.  She denies postnasal drip or worsening reflux symptoms.  She has had very little change in her dyspnea on exertion which is chronic.  She denies any fevers, chills, or sweats.   Review of Systems  Constitutional: Negative for fever and unexpected weight change.  HENT: Negative for congestion, dental problem, ear pain, nosebleeds, postnasal drip, rhinorrhea, sinus pressure, sneezing, sore throat and trouble swallowing.        Dry mouth/throat  Eyes: Negative for redness and itching.  Respiratory: Positive for cough, chest tightness and shortness of breath. Negative for wheezing.   Cardiovascular: Negative for palpitations and leg swelling.  Gastrointestinal: Negative for nausea and vomiting.  Genitourinary: Negative for dysuria.  Musculoskeletal: Negative for joint swelling.  Skin: Negative for rash.  Neurological: Negative for headaches.  Hematological: Does not bruise/bleed easily.  Psychiatric/Behavioral: Negative for dysphoric mood. The patient is not nervous/anxious.        Objective:   Physical Exam Well-developed female in no acute distress Nose without purulence or discharge noted Oropharynx clear Neck without lymphadenopathy or thyromegaly Chest with crackles both bases, up proximally one quarter the way up bilaterally.  No wheezes or rhonchi noted Cardiac exam with regular rate and rhythm Lower extremities with minimal edema, no cyanosis Alert and oriented, moves all 4 extremities.       Assessment & Plan:

## 2013-10-20 NOTE — Telephone Encounter (Signed)
Called, spoke with pt.  Reports she is still coughing.  Cough is nonprod. She feels mucus is breaking up but still unable to cough anything up.  Also, reports increased SOB at rest and with exertion, chest heaviness, and chest tightness.  No wheezing.  Pt is taking mucinex dm 600 mg bid as directed per RA on 10/13/13 phone msg with not much relief.  We have scheduled pt to see [facility] today at 11 am given RA is out of the office.  Pt aware and voiced no further questions or concerns at this time.

## 2013-10-20 NOTE — Patient Instructions (Signed)
Will treat with an 8day course of prednisone to see if cough improves. Will give you a prescription for hydromet cough syrup.  Use as needed as directed.  This contains hydrocodone, not codeine. Please call if your symptoms are not improving. Keep followup apptm with Dr. Vassie Loll as scheduled.

## 2013-10-20 NOTE — Assessment & Plan Note (Signed)
The patient has had worsening cough over the last 2 weeks which is dry in nature, and is not producing any significant mucus or purulence.  She denies postnasal drip symptoms, as well as reflux.  She does have a history of pulmonary fibrosis, and it is documented that she has responded in the past to a short course of prednisone with respect to her cough.  We'll go ahead and treat her with a short course of prednisone, as well as cough suppression.  She is to let us know if her cough does not return to baseline.  There is nothing currently to suggest an active pulmonary infection.

## 2013-10-24 ENCOUNTER — Encounter: Payer: Self-pay | Admitting: Cardiology

## 2013-11-15 ENCOUNTER — Encounter: Payer: Self-pay | Admitting: *Deleted

## 2013-12-11 ENCOUNTER — Other Ambulatory Visit: Payer: Self-pay

## 2013-12-11 DIAGNOSIS — Z853 Personal history of malignant neoplasm of breast: Secondary | ICD-10-CM

## 2013-12-11 DIAGNOSIS — Z1231 Encounter for screening mammogram for malignant neoplasm of breast: Secondary | ICD-10-CM

## 2013-12-11 DIAGNOSIS — Z9889 Other specified postprocedural states: Secondary | ICD-10-CM

## 2013-12-11 DIAGNOSIS — Z9011 Acquired absence of right breast and nipple: Secondary | ICD-10-CM

## 2014-01-04 ENCOUNTER — Ambulatory Visit
Admission: RE | Admit: 2014-01-04 | Discharge: 2014-01-04 | Disposition: A | Discharge status: Home / Self Care | Payer: Medicare Other | Source: Ambulatory Visit

## 2014-01-04 DIAGNOSIS — Z853 Personal history of malignant neoplasm of breast: Secondary | ICD-10-CM

## 2014-01-04 DIAGNOSIS — Z9011 Acquired absence of right breast and nipple: Secondary | ICD-10-CM

## 2014-01-04 DIAGNOSIS — Z9889 Other specified postprocedural states: Secondary | ICD-10-CM

## 2014-01-04 DIAGNOSIS — Z1231 Encounter for screening mammogram for malignant neoplasm of breast: Secondary | ICD-10-CM

## 2014-02-01 ENCOUNTER — Encounter: Payer: Medicare Other | Admitting: Family Medicine

## 2014-02-21 ENCOUNTER — Telehealth: Payer: Self-pay | Admitting: Family Medicine

## 2014-02-21 NOTE — Telephone Encounter (Signed)
error 

## 2014-02-26 ENCOUNTER — Telehealth: Payer: Self-pay | Admitting: Family Medicine

## 2014-02-26 DIAGNOSIS — Z853 Personal history of malignant neoplasm of breast: Secondary | ICD-10-CM

## 2014-02-26 DIAGNOSIS — K219 Gastro-esophageal reflux disease without esophagitis: Secondary | ICD-10-CM

## 2014-02-26 DIAGNOSIS — G500 Trigeminal neuralgia: Secondary | ICD-10-CM

## 2014-02-26 DIAGNOSIS — I10 Essential (primary) hypertension: Secondary | ICD-10-CM

## 2014-02-26 DIAGNOSIS — J8410 Pulmonary fibrosis, unspecified: Secondary | ICD-10-CM

## 2014-02-26 DIAGNOSIS — E785 Hyperlipidemia, unspecified: Secondary | ICD-10-CM

## 2014-02-26 DIAGNOSIS — G609 Hereditary and idiopathic neuropathy, unspecified: Secondary | ICD-10-CM

## 2014-02-26 DIAGNOSIS — H9190 Unspecified hearing loss, unspecified ear: Secondary | ICD-10-CM

## 2014-02-26 MED ORDER — CARBAMAZEPINE ER 100 MG PO TB12: ORAL_TABLET | ORAL | Status: DC

## 2014-02-26 NOTE — Telephone Encounter (Signed)
Rx sent to pharmacy   

## 2014-02-26 NOTE — Telephone Encounter (Signed)
Pt needs refill on carbamazepine 100 mg #60 call into [facility]

## 2014-03-05 ENCOUNTER — Ambulatory Visit (INDEPENDENT_AMBULATORY_CARE_PROVIDER_SITE_OTHER): Payer: Medicare Other | Admitting: Family Medicine

## 2014-03-05 VITALS — BP 114/70 | Temp 98.0°F | Ht 65.0 in | Wt 152.0 lb

## 2014-03-05 DIAGNOSIS — G500 Trigeminal neuralgia: Secondary | ICD-10-CM

## 2014-03-05 DIAGNOSIS — Z853 Personal history of malignant neoplasm of breast: Secondary | ICD-10-CM

## 2014-03-05 DIAGNOSIS — I10 Essential (primary) hypertension: Secondary | ICD-10-CM

## 2014-03-05 DIAGNOSIS — H9190 Unspecified hearing loss, unspecified ear: Secondary | ICD-10-CM

## 2014-03-05 DIAGNOSIS — K219 Gastro-esophageal reflux disease without esophagitis: Secondary | ICD-10-CM

## 2014-03-05 DIAGNOSIS — E785 Hyperlipidemia, unspecified: Secondary | ICD-10-CM

## 2014-03-05 DIAGNOSIS — G609 Hereditary and idiopathic neuropathy, unspecified: Secondary | ICD-10-CM

## 2014-03-05 DIAGNOSIS — J8410 Pulmonary fibrosis, unspecified: Secondary | ICD-10-CM

## 2014-03-05 DIAGNOSIS — Z23 Encounter for immunization: Secondary | ICD-10-CM

## 2014-03-05 LAB — HEPATIC FUNCTION PANEL
ALK PHOS: 134 U/L — AB (ref 39–117)
ALT: 10 U/L (ref 0–35)
AST: 12 U/L (ref 0–37)
Albumin: 4 g/dL (ref 3.5–5.2)
BILIRUBIN DIRECT: 0.1 mg/dL (ref 0.0–0.3)
Total Bilirubin: 0.8 mg/dL (ref 0.3–1.2)
Total Protein: 6.7 g/dL (ref 6.0–8.3)

## 2014-03-05 LAB — CBC WITH DIFFERENTIAL/PLATELET
BASOS ABS: 0 10*3/uL (ref 0.0–0.1)
BASOS PCT: 0.7 % (ref 0.0–3.0)
Eosinophils Absolute: 0.3 10*3/uL (ref 0.0–0.7)
Eosinophils Relative: 7.8 % — ABNORMAL HIGH (ref 0.0–5.0)
HEMATOCRIT: 37.8 % (ref 36.0–46.0)
HEMOGLOBIN: 12.2 g/dL (ref 12.0–15.0)
LYMPHS ABS: 1.4 10*3/uL (ref 0.7–4.0)
Lymphocytes Relative: 38.3 % (ref 12.0–46.0)
MCHC: 32.3 g/dL (ref 30.0–36.0)
MCV: 100.9 fl — ABNORMAL HIGH (ref 78.0–100.0)
MONOS PCT: 12.2 % — AB (ref 3.0–12.0)
Monocytes Absolute: 0.4 10*3/uL (ref 0.1–1.0)
Neutro Abs: 1.5 10*3/uL (ref 1.4–7.7)
Neutrophils Relative %: 41 % — ABNORMAL LOW (ref 43.0–77.0)
Platelets: 165 10*3/uL (ref 150.0–400.0)
RBC: 3.75 Mil/uL — AB (ref 3.87–5.11)
RDW: 14.9 % — AB (ref 11.5–14.6)
WBC: 3.7 10*3/uL — ABNORMAL LOW (ref 4.5–10.5)

## 2014-03-05 LAB — POCT URINALYSIS DIPSTICK
BILIRUBIN UA: NEGATIVE
Glucose, UA: NEGATIVE
KETONES UA: NEGATIVE
NITRITE UA: NEGATIVE
Protein, UA: NEGATIVE
SPEC GRAV UA: 1.015
Urobilinogen, UA: 0.2
pH, UA: 7.5

## 2014-03-05 LAB — BASIC METABOLIC PANEL
BUN: 19 mg/dL (ref 6–23)
CHLORIDE: 102 meq/L (ref 96–112)
CO2: 29 mEq/L (ref 19–32)
Calcium: 9.2 mg/dL (ref 8.4–10.5)
Creatinine, Ser: 1 mg/dL (ref 0.4–1.2)
GFR: 59.23 mL/min — ABNORMAL LOW (ref >60.00)
GLUCOSE: 98 mg/dL (ref 70–99)
POTASSIUM: 4.3 meq/L (ref 3.5–5.1)
Sodium: 140 mEq/L (ref 135–145)

## 2014-03-05 LAB — TSH: TSH: 0.41 u[IU]/mL (ref 0.35–5.50)

## 2014-03-05 LAB — LIPID PANEL
Cholesterol: 226 mg/dL — ABNORMAL HIGH (ref 0–200)
HDL: 99.9 mg/dL (ref >39.00)
LDL CALC: 114 mg/dL — AB (ref 0–99)
TRIGLYCERIDES: 61 mg/dL (ref 0.0–149.0)
Total CHOL/HDL Ratio: 2
VLDL: 12.2 mg/dL (ref 0.0–40.0)

## 2014-03-05 MED ORDER — PRAVASTATIN SODIUM 20 MG PO TABS: 20.0000 mg | ORAL_TABLET | Freq: Every day | ORAL | Status: DC

## 2014-03-05 MED ORDER — CARBAMAZEPINE ER 100 MG PO TB12: ORAL_TABLET | ORAL | Status: DC

## 2014-03-05 MED ORDER — OMEPRAZOLE 20 MG PO CPDR: 20.0000 mg | DELAYED_RELEASE_CAPSULE | Freq: Every day | ORAL | Status: AC | PRN

## 2014-03-05 MED ORDER — FUROSEMIDE 20 MG PO TABS: 20.0000 mg | ORAL_TABLET | Freq: Every day | ORAL | Status: DC

## 2014-03-05 MED ORDER — METOPROLOL SUCCINATE ER 100 MG PO TB24
ORAL_TABLET | ORAL | Status: DC
Start: 2014-03-05 — End: 2015-06-04

## 2014-03-05 NOTE — Progress Notes (Signed)
   Subjective:    Patient ID: Amanda Long, female    DOB: [birth year], 80 y.o.   MRN: [id number]  HPI Amanda Long is a 80 year old female nonsmoker,,,,,,,,,, takes care of her mother whose 59,,,,,,,, who comes in for her Medicare wellness examination  She has a history of hypertension is on Norvasc 5 mg and beta blocker 50 mg twice a day BP today 114/70 pulse 60 and she's lightheaded when she stands up  Her other medicines reviewed the been no changes.  She gets routine eye care, dental care, BSE monthly, and you mammography, previous colonoscopies normal she's a stat of those.  Cognitive function normal she does not exercise on a regular basis home health safety reviewed no issues identified, no guns in the house, she does have a health care power of attorney and living well  Vaccinations updated   Review of Systems  Constitutional: Negative.   HENT: Negative.   Eyes: Negative.   Respiratory: Negative.   Cardiovascular: Negative.   Gastrointestinal: Negative.   Genitourinary: Negative.   Musculoskeletal: Negative.   Neurological: Negative.   Psychiatric/Behavioral: Negative.        Objective:   Physical Exam  Nursing note and vitals reviewed. Constitutional: She appears well-developed and well-nourished.  HENT:  Head: Normocephalic and atraumatic.  Right Ear: External ear normal.  Left Ear: External ear normal.  Nose: Nose normal.  Mouth/Throat: Oropharynx is clear and moist.  Eyes: EOM are normal. Pupils are equal, round, and reactive to light.  Neck: Normal range of motion. Neck supple. No thyromegaly present.  Cardiovascular: Normal rate, regular rhythm, normal heart sounds and intact distal pulses.  Exam reveals no gallop and no friction rub.   No murmur heard. Pulmonary/Chest: Effort normal and breath sounds normal.  Abdominal: Soft. Bowel sounds are normal. She exhibits no distension and no mass. There is no tenderness. There is no rebound.  Genitourinary:  Left  breast is normal right breast is surgically repaired implant no palpable masses  Musculoskeletal: Normal range of motion.  Lymphadenopathy:    She has no cervical adenopathy.  Neurological: She is alert. She has normal reflexes. No cranial nerve deficit. She exhibits normal muscle tone. Coordination normal.  Skin: Skin is warm and dry.  Psychiatric: She has a normal mood and affect. Her behavior is normal. Judgment and thought content normal.          Assessment & Plan:  Hypertension........ BP too low..... decrease beta blocker to 50 mg daily followup in one month  Reflux esophagitis continue Prilosec  Hyperlipidemia continue Pravachol History of trigeminal neuralgia continue Tegretol 200 mg twice daily.

## 2014-03-05 NOTE — Patient Instructions (Signed)
Continue your medications except decrease the Toprol to one half tab daily in the morning  Followup in 4 weeks

## 2014-03-05 NOTE — Progress Notes (Signed)
Pre visit review using our clinic review tool, if applicable. No additional management support is needed unless otherwise documented below in the visit note. 

## 2014-03-06 ENCOUNTER — Telehealth: Payer: Self-pay | Admitting: Family Medicine

## 2014-03-06 NOTE — Telephone Encounter (Signed)
Relevant patient education assigned to patient using Emmi. ° °

## 2014-03-09 ENCOUNTER — Telehealth: Payer: Self-pay

## 2014-03-09 DIAGNOSIS — I10 Essential (primary) hypertension: Secondary | ICD-10-CM

## 2014-03-09 DIAGNOSIS — Z853 Personal history of malignant neoplasm of breast: Secondary | ICD-10-CM

## 2014-03-09 DIAGNOSIS — G609 Hereditary and idiopathic neuropathy, unspecified: Secondary | ICD-10-CM

## 2014-03-09 DIAGNOSIS — K219 Gastro-esophageal reflux disease without esophagitis: Secondary | ICD-10-CM

## 2014-03-09 DIAGNOSIS — J8410 Pulmonary fibrosis, unspecified: Secondary | ICD-10-CM

## 2014-03-09 DIAGNOSIS — G500 Trigeminal neuralgia: Secondary | ICD-10-CM

## 2014-03-09 DIAGNOSIS — E785 Hyperlipidemia, unspecified: Secondary | ICD-10-CM

## 2014-03-09 DIAGNOSIS — H9190 Unspecified hearing loss, unspecified ear: Secondary | ICD-10-CM

## 2014-03-09 MED ORDER — AMLODIPINE BESYLATE 5 MG PO TABS: 5.0000 mg | ORAL_TABLET | Freq: Every day | ORAL | Status: DC

## 2014-03-09 NOTE — Telephone Encounter (Signed)
Pt said Dr Sherren Mocha refill all her meds except one and she is req rx on amLODipine (NORVASC) 5 MG tablet [facility]

## 2014-04-05 ENCOUNTER — Encounter: Payer: Self-pay | Admitting: Family Medicine

## 2014-04-05 ENCOUNTER — Ambulatory Visit (INDEPENDENT_AMBULATORY_CARE_PROVIDER_SITE_OTHER): Payer: Medicare Other | Admitting: Family Medicine

## 2014-04-05 VITALS — BP 150/70 | Temp 98.4°F | Wt 153.0 lb

## 2014-04-05 DIAGNOSIS — I10 Essential (primary) hypertension: Secondary | ICD-10-CM

## 2014-04-05 NOTE — Progress Notes (Signed)
   Subjective:    Patient ID: Amanda Long, female    DOB: January 06, 1925, 80 y.o.   MRN: [id number]  HPI Amanda Long is a 80 year old single female....Amanda KitchenMarland Long her mother just turned 62.... who comes in today for followup of hypotension  We saw her well back her blood pressures too low she's lightheaded when she stood up. We therefore decreased her beta blocker to a half a tablet a day cut her Norvasc from 10 mg a day to 5. BP today 150/70 pulse 70 regular blood pressures it she's getting at home are all within that range. No longer lightheaded when she stands   Review of Systems Negative    Objective:   Physical Exam  Well-developed well-nourished female no acute distress vital signs stable she's afebrile BP 150/70      Assessment & Plan:  Hypertension at goal continue current therapy followup January 2016 for annual exam

## 2014-04-05 NOTE — Patient Instructions (Signed)
Continue current medication  Check your blood pressure weekly  Return in January 2016 for your annual exam  Labs one week prior

## 2014-04-05 NOTE — Progress Notes (Signed)
Pre visit review using our clinic review tool, if applicable. No additional management support is needed unless otherwise documented below in the visit note. 

## 2014-05-08 IMAGING — CR DG CHEST 2V
2 series · 2 of 2 positions shown · non-contrast
Comparison: 07/16/2011

CLINICAL DATA: Post inflammatory pulmonary fibrosis, breast cancer,
hypertension, former smoker, hyperlipidemia, GERD

CHEST - 2 VIEW

[view not recorded (1 of 2)]
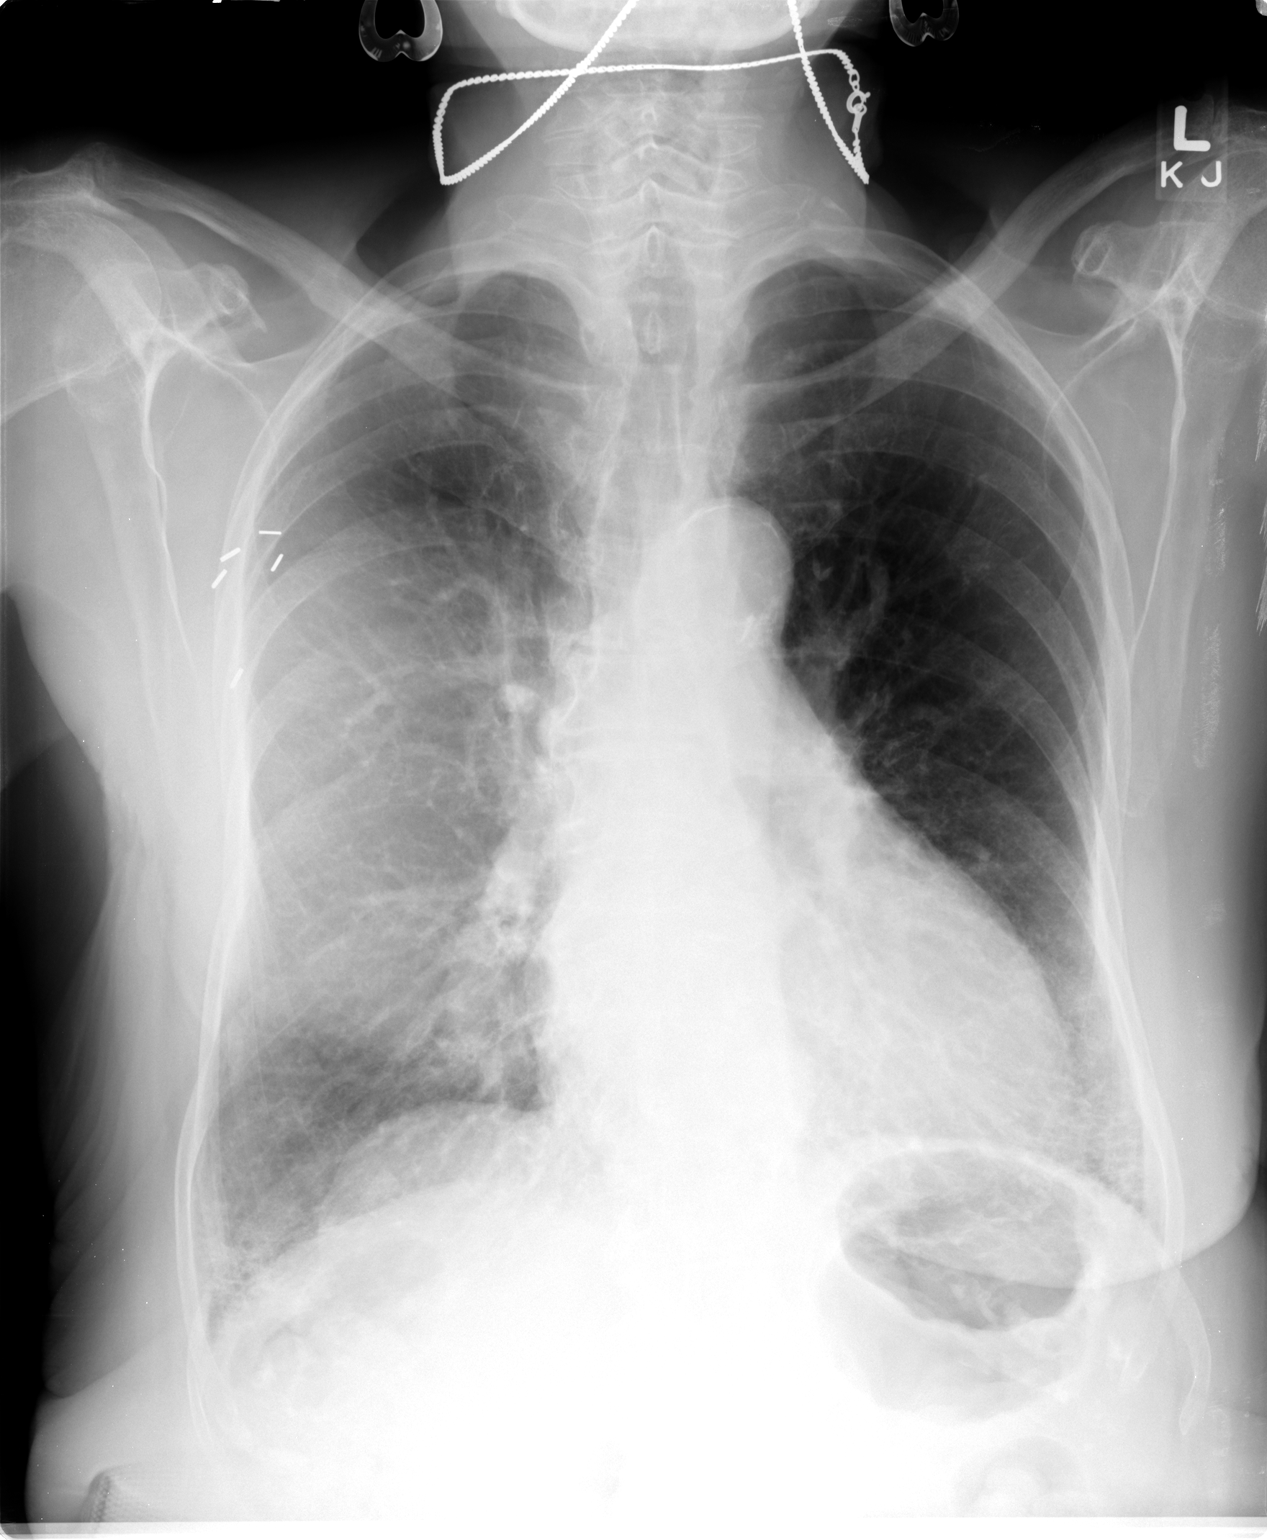

[view not recorded (2 of 2)]
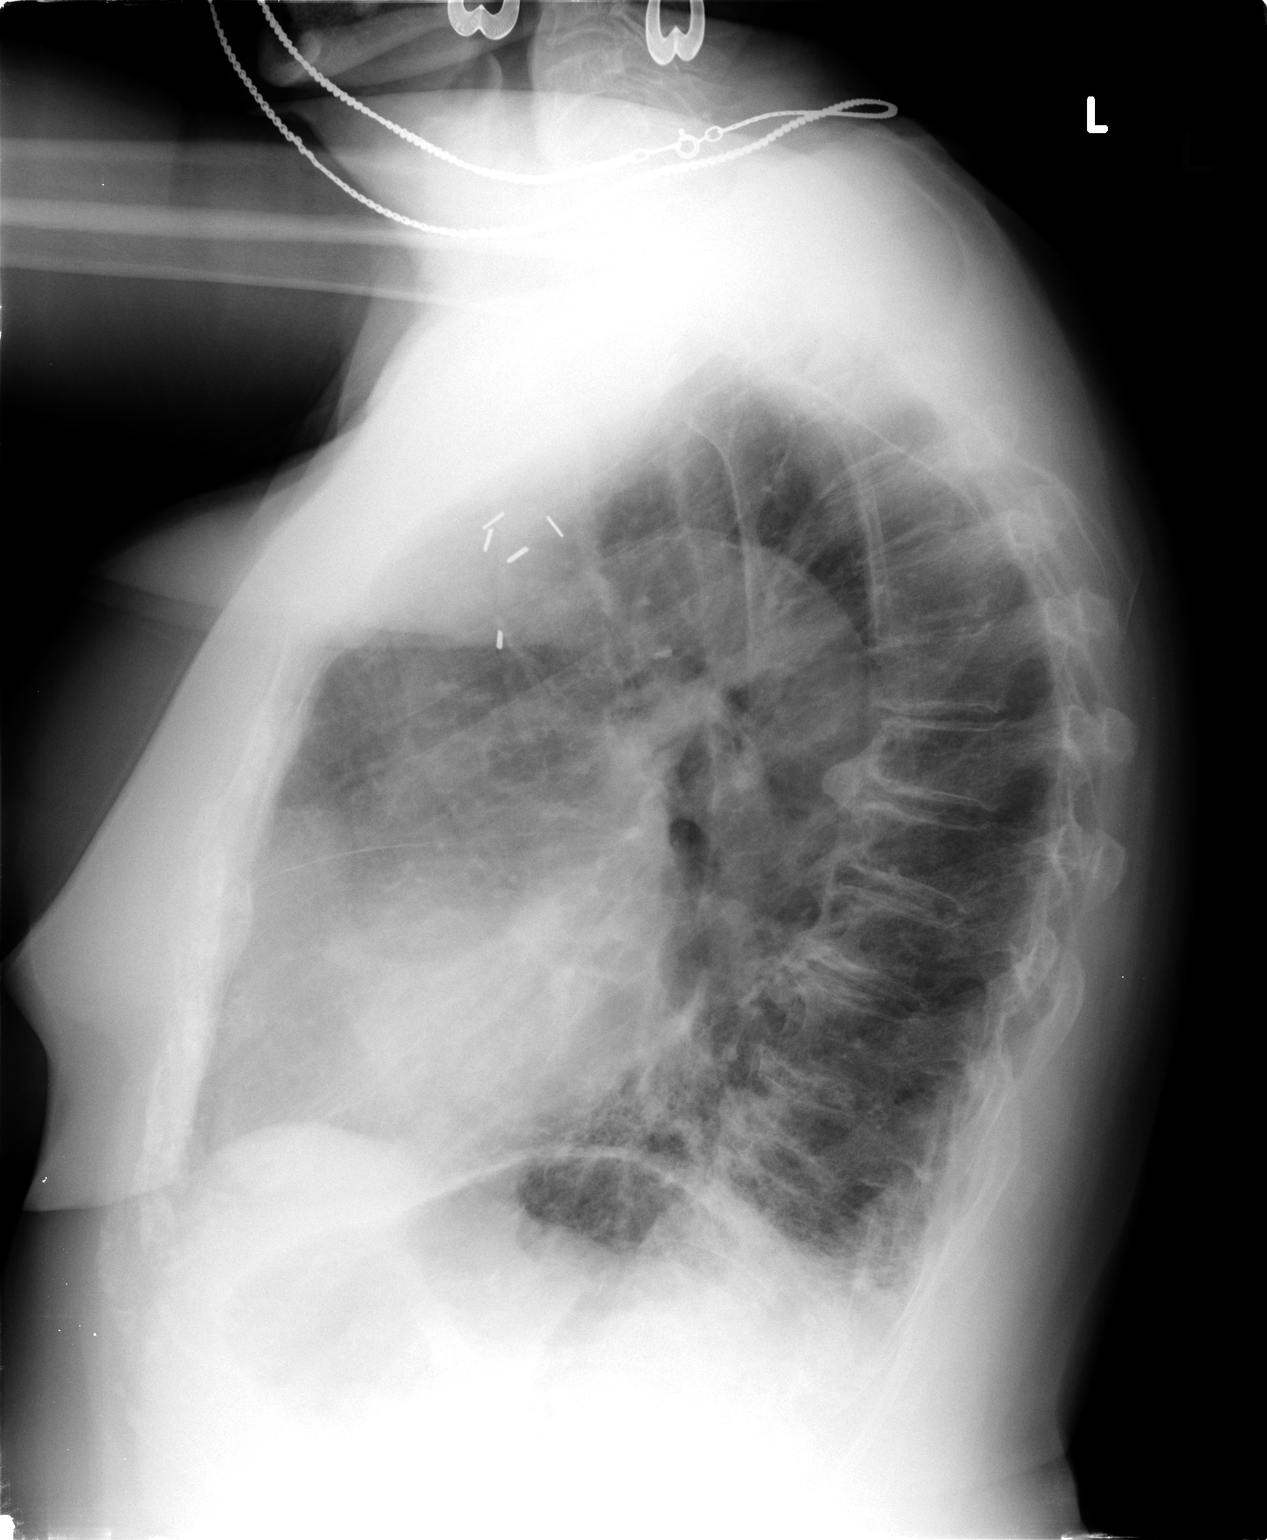

[2 of 2 positions shown; findings below may reference images not displayed]

FINDINGS: Enlargement of cardiac silhouette with pulmonary vascular
congestion.
Atherosclerotic calcification aorta.
Chronic accentuation of interstitial markings in the mid-to-lower
lungs, minimally progressive on the right.
Underlying emphysematous and minimal chronic bronchitic changes.
No new infiltrate, pleural effusion or pneumothorax.
Osseous demineralization with scattered endplate spur formation
thoracic spine and mild biconvex thoracolumbar scoliosis.
Post right mastectomy and axillary node dissection.
Density in the mid to right hemithorax on the AP view is likely
related to prior surgery and breast reconstruction.
IMPRESSION: Slightly increased bibasilar fibrosis.
Underlying COPD.
Enlargement of cardiac silhouette with pulmonary vascular
congestion.

## 2014-10-29 ENCOUNTER — Ambulatory Visit (INDEPENDENT_AMBULATORY_CARE_PROVIDER_SITE_OTHER)
Admission: RE | Admit: 2014-10-29 | Discharge: 2014-10-29 | Disposition: A | Discharge status: Home / Self Care | Payer: Medicare Other | Source: Ambulatory Visit | Attending: Internal Medicine | Admitting: Internal Medicine

## 2014-10-29 ENCOUNTER — Encounter: Payer: Self-pay | Admitting: Internal Medicine

## 2014-10-29 ENCOUNTER — Ambulatory Visit (INDEPENDENT_AMBULATORY_CARE_PROVIDER_SITE_OTHER): Payer: Medicare Other | Admitting: Internal Medicine

## 2014-10-29 VITALS — BP 160/80 | HR 62 | Ht 62.75 in | Wt 156.8 lb

## 2014-10-29 DIAGNOSIS — R05 Cough: Secondary | ICD-10-CM

## 2014-10-29 DIAGNOSIS — K219 Gastro-esophageal reflux disease without esophagitis: Secondary | ICD-10-CM

## 2014-10-29 DIAGNOSIS — R059 Cough, unspecified: Secondary | ICD-10-CM

## 2014-10-29 DIAGNOSIS — J8410 Pulmonary fibrosis, unspecified: Secondary | ICD-10-CM

## 2014-10-29 MED ORDER — PREDNISONE 10 MG PO TABS: ORAL_TABLET | ORAL | Status: DC

## 2014-10-29 NOTE — Patient Instructions (Signed)
For cough take delsym 2 tsp every 12 hours  zpak  Prednisone 10 mg take  4 each am x 2 days,   2 each am x 2 days,  1 each am x 2 days and stop   Try prilosec 20mg   Take 30-60 min before first meal of the day and Pepcid ac 20 mg one bedtime until cough is completely gone for at least a week without the need for cough suppression  GERD (REFLUX)  is an extremely common cause of respiratory symptoms just like yours , many times with no obvious heartburn at all.    It can be treated with medication, but also with lifestyle changes including avoidance of late meals, excessive alcohol, smoking cessation, and avoid fatty foods, chocolate, peppermint, colas, red wine, and acidic juices such as orange juice.  NO MINT OR MENTHOL PRODUCTS SO NO COUGH DROPS  USE SUGARLESS CANDY INSTEAD (Jolley ranchers or Stover's or Life Savers) or even ice chips will also do - the key is to swallow to prevent all throat clearing. NO OIL BASED VITAMINS - use powdered substitutes.

## 2014-10-29 NOTE — Progress Notes (Signed)
Subjective:    Patient ID: Amanda Long, female    DOB: June 18, 1925    MRN: [id number]  HPI  82 yowf , remote smoker, with ILD, preserved lung function  Presented with cough & dyspnea since 2/10  She underwent lumpectomy & LN dissection in '85 but had skin recurrence & required mastectomy with breast implant in '89 followed by chemo-RT. Her serial CXRs show a haze over the lower Rt hemithorax due to the implant which have been misinterpreted as pneumonia.  Zestril was stopped 05/02/09. Codeine syrup makes her nauseous but she can tolerate this at night. Prilosec used for GERD, no symptoms of heartburn. Takes tegretol for neuralgia  CT chest 6/10 >> mediastinal lymphadenopathy, extensive fibrosis & honeycombing BLL & pulmonary arterial dilation.  7/10 >>COugh better with prednisone & tessalon.ANA, RA neg, ESR 74  PFTs 8/13 >> lung volumes preserved, DLCO 47% stable compared to 6//10        10/29/2014 acute  ov/Amanda Long re:  Chief Complaint  Patient presents with  . Acute Visit    Pt c/o increased SOB, cough- non prod, and heaviness in chest- esp on the rt side x 10 days.    doing ok until 10 day onset hacking cough/ sensation of mucus stuck in throat. Exact same symptoms with prev flares including tightness more R than L she attributes to prev RT   No obvious day to day or daytime variabilty or assoc subjective wheeze overt sinus or hb symptoms. No unusual exp hx or h/o childhood pna/ asthma or knowledge of premature birth.  Sleeping ok without nocturnal  or early am exacerbation  of respiratory  c/o's or need for noct saba. Also denies any obvious fluctuation of symptoms with weather or environmental changes or other aggravating or alleviating factors except as outlined above   Current Medications, Allergies, Complete Past Medical History, Past Surgical History, Family History, and Social History were reviewed in [employer] record.  ROS  The following are not  active complaints unless bolded sore throat, dysphagia, dental problems, itching, sneezing,  nasal congestion or excess/ purulent secretions, ear ache,   fever, chills, sweats, unintended wt loss, pleuritic or exertional cp, hemoptysis,  orthopnea pnd or leg swelling, presyncope, palpitations, heartburn, abdominal pain, anorexia, nausea, vomiting, diarrhea  or change in bowel or urinary habits, change in stools or urine, dysuria,hematuria,  rash, arthralgias, visual complaints, headache, numbness weakness or ataxia or problems with walking or coordination,  change in mood/affect or memory.           Objective:   Physical Exam   hoarse amb wf nad  Wt Readings from Last 3 Encounters:  10/29/14 156 lb 12.8 oz (71.124 kg)  04/05/14 153 lb (69.4 kg)  03/05/14 152 lb (68.947 kg)    Vital signs reviewed   HEENT: nl dentition, turbinates, and orophanx. Nl external ear canals without cough reflex   NECK :  without JVD/Nodes/TM/ nl carotid upstrokes bilaterally   LUNGS: no acc muscle use, minimal rhonchi R > L insp and exp    CV:  RRR  no s3 or murmur or increase in P2, no edema   ABD:  soft and nontender with nl excursion in the supine position. No bruits or organomegaly, bowel sounds nl  MS:  warm without deformities, calf tenderness, cyanosis or clubbing  SKIN: warm and dry without lesions    NEURO:  alert, approp, no deficits    cxr ordered, not done    Assessment & Plan:

## 2014-10-30 ENCOUNTER — Telehealth: Payer: Self-pay | Admitting: *Deleted

## 2014-10-30 MED ORDER — AZITHROMYCIN 250 MG PO TABS: 250.0000 mg | ORAL_TABLET | ORAL | Status: DC

## 2014-10-30 NOTE — Telephone Encounter (Signed)
-----   Message from Tanda Rockers, MD sent at 10/30/2014  6:38 AM EST ----- i had intended to do cxr but forgot to tell her Call and see if feeling better and if not needs to return just for cxr by end of week

## 2014-10-30 NOTE — Telephone Encounter (Signed)
Spoke with the pt  She states that she did go for cxr  Her results are in the system  Please advise thanks!

## 2014-10-30 NOTE — Assessment & Plan Note (Signed)
CT chest 05/23/2009 >> mediastinal lymphadenopathy, extensive fibrosis & honeycombing BLL& pulmonary arterial dilation.  06/2009 >>COugh better with prednisone & tessalon.ANA, RA neg, ESR 74  PFTs 07/20/2012 >> lung volumes preserved, DLCO 47% stable compared to 6//10  No evidence of dz progression

## 2014-10-30 NOTE — Progress Notes (Signed)
Quick Note:  Spoke with pt and notified of results per Dr. Wert. Pt verbalized understanding and denied any questions.  ______ 

## 2014-10-30 NOTE — Assessment & Plan Note (Signed)
See cough discussion in setting of ? Samuel Germany

## 2014-10-30 NOTE — Assessment & Plan Note (Signed)
Recurrent but not chronic with acute flare now ? Assoc with URI  Explained the natural history of uri and why it's necessary in patients at risk to treat GERD aggressively - at least  short term -   to reduce risk of evolving cyclical cough initially  triggered by epithelial injury and a heightened sensitivty to the effects of any upper airway irritants,  most importantly acid - related - then perpetuated by epithelial injury related to the cough itself as the upper airway collapses on itself.  That is, the more sensitive the epithelium becomes once it is damaged by the virus, the more the ensuing irritability> the more the cough, the more the secondary reflux (especially in those prone to reflux) the more the irritation of the sensitive mucosa and so on in a  Classic cyclical pattern.    rx pred/ zpak, return for cxr if not better by 11/02/14

## 2015-02-18 ENCOUNTER — Other Ambulatory Visit: Payer: Self-pay

## 2015-02-18 DIAGNOSIS — Z1231 Encounter for screening mammogram for malignant neoplasm of breast: Secondary | ICD-10-CM

## 2015-03-04 ENCOUNTER — Other Ambulatory Visit: Payer: Self-pay

## 2015-03-04 DIAGNOSIS — E785 Hyperlipidemia, unspecified: Secondary | ICD-10-CM

## 2015-03-04 DIAGNOSIS — H9190 Unspecified hearing loss, unspecified ear: Secondary | ICD-10-CM

## 2015-03-04 DIAGNOSIS — J8410 Pulmonary fibrosis, unspecified: Secondary | ICD-10-CM

## 2015-03-04 DIAGNOSIS — K219 Gastro-esophageal reflux disease without esophagitis: Secondary | ICD-10-CM

## 2015-03-04 DIAGNOSIS — I10 Essential (primary) hypertension: Secondary | ICD-10-CM

## 2015-03-04 DIAGNOSIS — G609 Hereditary and idiopathic neuropathy, unspecified: Secondary | ICD-10-CM

## 2015-03-04 DIAGNOSIS — Z853 Personal history of malignant neoplasm of breast: Secondary | ICD-10-CM

## 2015-03-04 DIAGNOSIS — G500 Trigeminal neuralgia: Secondary | ICD-10-CM

## 2015-03-04 MED ORDER — AMLODIPINE BESYLATE 5 MG PO TABS: 5.0000 mg | ORAL_TABLET | Freq: Every day | ORAL | Status: DC

## 2015-03-04 MED ORDER — FUROSEMIDE 20 MG PO TABS: 20.0000 mg | ORAL_TABLET | Freq: Every day | ORAL | Status: DC

## 2015-03-04 MED ORDER — PRAVASTATIN SODIUM 20 MG PO TABS: 20.0000 mg | ORAL_TABLET | Freq: Every day | ORAL | Status: DC

## 2015-03-04 NOTE — Addendum Note (Signed)
Addended by: Colleen Can on: 03/04/2015 02:17 PM   Modules accepted: Orders

## 2015-03-04 NOTE — Telephone Encounter (Signed)
Rx request for amlodipine besylate 5 mg tablet-Take 1 tablet by mouth once daily #100  Furosemide 20 mg tablet-take 1 tabled by mouth once daily #100  Pharm:  [facility]  Rx sent.

## 2015-03-06 ENCOUNTER — Ambulatory Visit
Admission: RE | Admit: 2015-03-06 | Discharge: 2015-03-06 | Disposition: A | Discharge status: Home / Self Care | Payer: Medicare Other | Source: Ambulatory Visit

## 2015-03-06 ENCOUNTER — Other Ambulatory Visit: Payer: Self-pay

## 2015-03-06 DIAGNOSIS — Z1231 Encounter for screening mammogram for malignant neoplasm of breast: Secondary | ICD-10-CM

## 2015-03-14 ENCOUNTER — Telehealth: Payer: Self-pay

## 2015-03-14 DIAGNOSIS — Z853 Personal history of malignant neoplasm of breast: Secondary | ICD-10-CM

## 2015-03-14 DIAGNOSIS — I10 Essential (primary) hypertension: Secondary | ICD-10-CM

## 2015-03-14 DIAGNOSIS — J8410 Pulmonary fibrosis, unspecified: Secondary | ICD-10-CM

## 2015-03-14 DIAGNOSIS — G500 Trigeminal neuralgia: Secondary | ICD-10-CM

## 2015-03-14 DIAGNOSIS — K219 Gastro-esophageal reflux disease without esophagitis: Secondary | ICD-10-CM

## 2015-03-14 DIAGNOSIS — H9190 Unspecified hearing loss, unspecified ear: Secondary | ICD-10-CM

## 2015-03-14 DIAGNOSIS — G609 Hereditary and idiopathic neuropathy, unspecified: Secondary | ICD-10-CM

## 2015-03-14 DIAGNOSIS — E785 Hyperlipidemia, unspecified: Secondary | ICD-10-CM

## 2015-03-14 MED ORDER — CARBAMAZEPINE ER 100 MG PO TB12: ORAL_TABLET | ORAL | Status: DC

## 2015-03-14 NOTE — Telephone Encounter (Signed)
Rx refill for Tegretol XR 100 mg tablet- Take 2 tablets by mouth twice daily. #360  Pharm: [facility]

## 2015-04-17 ENCOUNTER — Encounter: Payer: Medicare Other | Admitting: Family Medicine

## 2015-05-07 ENCOUNTER — Ambulatory Visit: Payer: Medicare Other | Admitting: Family Medicine

## 2015-05-08 ENCOUNTER — Ambulatory Visit: Payer: Medicare Other | Admitting: Family Medicine

## 2015-06-03 ENCOUNTER — Encounter: Payer: Self-pay | Admitting: Adult Health

## 2015-06-03 ENCOUNTER — Ambulatory Visit (INDEPENDENT_AMBULATORY_CARE_PROVIDER_SITE_OTHER): Payer: Medicare Other | Admitting: Adult Health

## 2015-06-03 ENCOUNTER — Telehealth: Payer: Self-pay

## 2015-06-03 VITALS — BP 138/80 | Temp 98.1°F | Ht 62.0 in | Wt 161.0 lb

## 2015-06-03 DIAGNOSIS — W19XXXA Unspecified fall, initial encounter: Secondary | ICD-10-CM | POA: Diagnosis not present

## 2015-06-03 DIAGNOSIS — Z7189 Other specified counseling: Secondary | ICD-10-CM | POA: Diagnosis not present

## 2015-06-03 DIAGNOSIS — Z853 Personal history of malignant neoplasm of breast: Secondary | ICD-10-CM

## 2015-06-03 DIAGNOSIS — I10 Essential (primary) hypertension: Secondary | ICD-10-CM

## 2015-06-03 DIAGNOSIS — Y92099 Unspecified place in other non-institutional residence as the place of occurrence of the external cause: Secondary | ICD-10-CM

## 2015-06-03 DIAGNOSIS — J8410 Pulmonary fibrosis, unspecified: Secondary | ICD-10-CM

## 2015-06-03 DIAGNOSIS — K219 Gastro-esophageal reflux disease without esophagitis: Secondary | ICD-10-CM

## 2015-06-03 DIAGNOSIS — G609 Hereditary and idiopathic neuropathy, unspecified: Secondary | ICD-10-CM

## 2015-06-03 DIAGNOSIS — E785 Hyperlipidemia, unspecified: Secondary | ICD-10-CM

## 2015-06-03 DIAGNOSIS — Z7689 Persons encountering health services in other specified circumstances: Secondary | ICD-10-CM

## 2015-06-03 DIAGNOSIS — Y92009 Unspecified place in unspecified non-institutional (private) residence as the place of occurrence of the external cause: Secondary | ICD-10-CM

## 2015-06-03 DIAGNOSIS — H9190 Unspecified hearing loss, unspecified ear: Secondary | ICD-10-CM

## 2015-06-03 DIAGNOSIS — G500 Trigeminal neuralgia: Secondary | ICD-10-CM

## 2015-06-03 MED ORDER — PRAVASTATIN SODIUM 20 MG PO TABS: 20.0000 mg | ORAL_TABLET | Freq: Every day | ORAL | Status: DC

## 2015-06-03 MED ORDER — AMLODIPINE BESYLATE 5 MG PO TABS: 5.0000 mg | ORAL_TABLET | Freq: Every day | ORAL | Status: DC

## 2015-06-03 MED ORDER — FUROSEMIDE 20 MG PO TABS: 20.0000 mg | ORAL_TABLET | Freq: Every day | ORAL | Status: DC

## 2015-06-03 NOTE — Progress Notes (Signed)
HPI:  Amanda Long is here to establish care. She is a very pleasant 81 year old caucasian female with the past medical history of  has a past medical history of GERD (gastroesophageal reflux disease); Hyperlipidemia; Hypertension; Peripheral neuropathy; Breast cancer; Duodenitis without mention of hemorrhage; Unspecified gastritis and gastroduodenitis without mention of hemorrhage; History of colon polyps; Diverticulosis of colon (without mention of hemorrhage); Internal hemorrhoids without mention of complication; and Blood transfusion. She was previously with Dr. Sherren Mocha  Last PCP and physical: 02/2014   Has the following chronic problems that require follow up and concerns today:  Fall - Two weeks ago she sustained a fall off a friends porch. Per the patient she was going to open the door to the house when she slipped off the porch. She sustained a bruised left eye and has pain below the right scapular area. She denies any other injuries, she denies that she passed out or had LOC. She does not take blood thinners.   ROS negative for unless reported above: fevers, chills,feeling poorly, unintentional weight loss, hearing or vision loss, chest pain, palpitations, leg claudication, struggling to breath,Not feeling congested in the chest, no orthopenia, no cough,no wheezing, normal appetite, no soft tissue swelling, no hemoptysis, melena, hematochezia, hematuria, , loc, si, or thoughts of self harm.  Immunizations:UTD Diet: Does not follow a diet Exercise: Does not have an exercise regimne Colonoscopy:2013 Dexa:Needs Mammogram: 02/2015  Past Medical History  Diagnosis Date  . GERD (gastroesophageal reflux disease)   . Hyperlipidemia   . Hypertension   . Peripheral neuropathy   . Breast cancer     Right   . Duodenitis without mention of hemorrhage   . Unspecified gastritis and gastroduodenitis without mention of hemorrhage   . History of colon polyps   . Diverticulosis of colon  (without mention of hemorrhage)   . Internal hemorrhoids without mention of complication   . Blood transfusion     1950    Past Surgical History  Procedure Laterality Date  . Total abdominal hysterectomy    . Tonsillectomy    . Mastectomy      right  . Appendectomy      Family History  Problem Relation Age of Onset  . Coronary artery disease      fhx  . Hypertension      fhx  . Glaucoma      fhx  . Colon cancer Neg Hx   . Cervical cancer Sister     History   Social History  . Marital Status: Widowed    Spouse Name: [name]  . Number of Children: [name]  . Years of Education: [name]   Occupational History  . retired Corporate treasurer    Social History Main Topics  . Smoking status: Former Smoker -- 0.50 packs/day for 20 years    Types: Cigarettes    Quit date: 12/15/1983  . Smokeless tobacco: Never Used  . Alcohol Use: No  . Drug Use: No  . Sexual Activity: Not on file   Other Topics Concern  . None   Social History Narrative   Regular exercise - yes     Current outpatient prescriptions:  .  amLODipine (NORVASC) 5 MG tablet, Take 1 tablet (5 mg total) by mouth daily., Disp: 100 tablet, Rfl: 0 .  aspirin 81 MG EC tablet, Take 81 mg by mouth daily.  , Disp: , Rfl:  .  Calcium Carbonate-Vitamin D (CALCIUM 600+D) 600-400 MG-UNIT per tablet, Twice weekly, Disp: , Rfl:  .  carbamazepine (TEGRETOL XR) 100 MG 12 hr tablet, 2  tablet twice daily, Disp: 360 tablet, Rfl: 3 .  Docusate Calcium (STOOL SOFTENER PO), Take by mouth as needed., Disp: , Rfl:  .  furosemide (LASIX) 20 MG tablet, Take 1 tablet (20 mg total) by mouth daily., Disp: 100 tablet, Rfl: 0 .  ibuprofen (ADVIL,MOTRIN) 200 MG tablet, Take 200 mg by mouth every 6 (six) hours as needed.  , Disp: , Rfl:  .  metoprolol succinate (TOPROL-XL) 100 MG 24 hr tablet, One half tablet daily, Disp: 100 tablet, Rfl: 3 .  omeprazole (PRILOSEC) 20 MG capsule, Take 1 capsule (20 mg total) by mouth daily as needed., Disp: 100 capsule, Rfl:  3 .  pravastatin (PRAVACHOL) 20 MG tablet, Take 1 tablet (20 mg total) by mouth at bedtime., Disp: 100 tablet, Rfl: 0 .  vitamin E 400 UNIT capsule, Twice a week, Disp: , Rfl:   EXAM:  Filed Vitals:   06/03/15 1408  BP: 138/80  Temp: 98.1 F (36.7 C)    Body mass index is 29.44 kg/(m^2).  GENERAL: vitals reviewed and listed above, alert, oriented, appears well hydrated and in no acute distress  HEENT: atraumatic, conjunttiva clear, no obvious abnormalities on inspection of external nose and ears. TM's visualized in both ear canals. Minimal cerumen impaction   NECK: Neck is soft and supple without masses, no adenopathy or thyromegaly, trachea midline, no JVD. Normal range of motion.   LUNGS: clear to auscultation bilaterally, no wheezes, rales or rhonchi, good air movement  CV: Regular rate and rhythm, normal S1/S2, no audible murmurs, gallops, or rubs. No carotid bruit and no peripheral edema.   MS: moves all extremities without noticeable abnormality. No edema noted. No bruising to back, no hematoma, no lacerations.   Abd: soft/nontender/nondistended/normal bowel sounds   Skin: warm and dry, no rash. Bruising around left eye, with multiple stages of healing.   Extremities: No clubbing, cyanosis, or edema. Capillary refill is WNL. Pulses intact bilaterally in upper and lower extremities.   Neuro: CN II-XII intact, sensation and reflexes normal throughout, 5/5 muscle strength in bilateral upper and lower extremities. Normal finger to nose. Normal rapid alternating movements.  PSYCH: pleasant and cooperative, no obvious depression or anxiety  ASSESSMENT AND PLAN:  Discussed the following assessment and plan:  1. Encounter to establish care - Follow up when available for CPE - Follow up when needed - Continue to eat healthy and attempt to exercise.  -   2. Fall at home, initial encounter -Fall education reviewed with patient - She declined x ray at this time.    No  diagnosis found. -We reviewed the PMH, PSH, FH, SH, Meds and Allergies. -We provided refills for any medications we will prescribe as needed. -We addressed current concerns per orders and patient instructions. -We have asked for records for pertinent exams, studies, vaccines and notes from previous providers. -We have advised patient to follow up per instructions below.   -Patient advised to return or notify a provider immediately if symptoms worsen or persist or new concerns arise.  There are no Patient Instructions on file for this visit.   [employer]

## 2015-06-03 NOTE — Progress Notes (Signed)
Pre visit review using our clinic review tool, if applicable. No additional management support is needed unless otherwise documented below in the visit note. 

## 2015-06-03 NOTE — Patient Instructions (Addendum)
It was great meeting you today! Please follow up with me at your convienience for a complete physical exam.   All of your medications will be sent to the pharmacy.   Please let me know if there is anything I can do for you.    Fall Prevention and Home Safety Falls cause injuries and can affect all age groups. It is possible to use preventive measures to significantly decrease the likelihood of falls. There are many simple measures which can make your home safer and prevent falls. OUTDOORS  Repair cracks and edges of walkways and driveways.  Remove high doorway thresholds.  Trim shrubbery on the main path into your home.  Have good outside lighting.  Clear walkways of tools, rocks, debris, and clutter.  Check that handrails are not broken and are securely fastened. Both sides of steps should have handrails.  Have leaves, snow, and ice cleared regularly.  Use sand or salt on walkways during winter months.  In the garage, clean up grease or oil spills. BATHROOM  Install night lights.  Install grab bars by the toilet and in the tub and shower.  Use non-skid mats or decals in the tub or shower.  Place a plastic non-slip stool in the shower to sit on, if needed.  Keep floors dry and clean up all water on the floor immediately.  Remove soap buildup in the tub or shower on a regular basis.  Secure bath mats with non-slip, double-sided rug tape.  Remove throw rugs and tripping hazards from the floors. BEDROOMS  Install night lights.  Make sure a bedside light is easy to reach.  Do not use oversized bedding.  Keep a telephone by your bedside.  Have a firm chair with side arms to use for getting dressed.  Remove throw rugs and tripping hazards from the floor. KITCHEN  Keep handles on pots and pans turned toward the center of the stove. Use back burners when possible.  Clean up spills quickly and allow time for drying.  Avoid walking on wet floors.  Avoid hot  utensils and knives.  Position shelves so they are not too high or low.  Place commonly used objects within easy reach.  If necessary, use a sturdy step stool with a grab bar when reaching.  Keep electrical cables out of the way.  Do not use floor polish or wax that makes floors slippery. If you must use wax, use non-skid floor wax.  Remove throw rugs and tripping hazards from the floor. STAIRWAYS  Never leave objects on stairs.  Place handrails on both sides of stairways and use them. Fix any loose handrails. Make sure handrails on both sides of the stairways are as long as the stairs.  Check carpeting to make sure it is firmly attached along stairs. Make repairs to worn or loose carpet promptly.  Avoid placing throw rugs at the top or bottom of stairways, or properly secure the rug with carpet tape to prevent slippage. Get rid of throw rugs, if possible.  Have an electrician put in a light switch at the top and bottom of the stairs. OTHER FALL PREVENTION TIPS  Wear low-heel or rubber-soled shoes that are supportive and fit well. Wear closed toe shoes.  When using a stepladder, make sure it is fully opened and both spreaders are firmly locked. Do not climb a closed stepladder.  Add color or contrast paint or tape to grab bars and handrails in your home. Place contrasting color strips on first and  last steps.  Learn and use mobility aids as needed. Install an electrical emergency response system.  Turn on lights to avoid dark areas. Replace light bulbs that burn out immediately. Get light switches that glow.  Arrange furniture to create clear pathways. Keep furniture in the same place.  Firmly attach carpet with non-skid or double-sided tape.  Eliminate uneven floor surfaces.  Select a carpet pattern that does not visually hide the edge of steps.  Be aware of all pets. OTHER HOME SAFETY TIPS  Set the water temperature for 120 F (48.8 C).  Keep emergency numbers on  or near the telephone.  Keep smoke detectors on every level of the home and near sleeping areas. Document Released: 11/20/2002 Document Revised: 05/31/2012 Document Reviewed: 02/19/2012 [facility] Patient Information 2015 [place], [place]. This information is not intended to replace advice given to you by your health care provider. Make sure you discuss any questions you have with your health care provider.

## 2015-06-03 NOTE — Telephone Encounter (Signed)
[id number] - [place] ([place]), [place] - [address]: amLODipine (NORVASC) 5 MG tablet, pravastatin (PRAVACHOL) 20 MG tablet, furosemide (LASIX) 20 MG tablet

## 2015-06-04 ENCOUNTER — Telehealth: Payer: Self-pay | Admitting: Family Medicine

## 2015-06-04 DIAGNOSIS — H9190 Unspecified hearing loss, unspecified ear: Secondary | ICD-10-CM

## 2015-06-04 DIAGNOSIS — K219 Gastro-esophageal reflux disease without esophagitis: Secondary | ICD-10-CM

## 2015-06-04 DIAGNOSIS — G500 Trigeminal neuralgia: Secondary | ICD-10-CM

## 2015-06-04 DIAGNOSIS — Z853 Personal history of malignant neoplasm of breast: Secondary | ICD-10-CM

## 2015-06-04 DIAGNOSIS — E785 Hyperlipidemia, unspecified: Secondary | ICD-10-CM

## 2015-06-04 DIAGNOSIS — G609 Hereditary and idiopathic neuropathy, unspecified: Secondary | ICD-10-CM

## 2015-06-04 DIAGNOSIS — J8410 Pulmonary fibrosis, unspecified: Secondary | ICD-10-CM

## 2015-06-04 DIAGNOSIS — I10 Essential (primary) hypertension: Secondary | ICD-10-CM

## 2015-06-04 MED ORDER — METOPROLOL SUCCINATE ER 100 MG PO TB24: ORAL_TABLET | ORAL | Status: DC

## 2015-06-04 NOTE — Telephone Encounter (Signed)
Rx sent 

## 2015-06-04 NOTE — Telephone Encounter (Signed)
Pt is in route to pharm per sister in law

## 2015-06-04 NOTE — Telephone Encounter (Signed)
Pt needs refill on metoprolol  100 mg #45 sent to [facility]

## 2015-07-10 ENCOUNTER — Encounter: Payer: Self-pay | Admitting: Adult Health

## 2015-07-10 ENCOUNTER — Ambulatory Visit (INDEPENDENT_AMBULATORY_CARE_PROVIDER_SITE_OTHER): Payer: Medicare Other | Admitting: Adult Health

## 2015-07-10 VITALS — BP 120/78 | Temp 97.9°F | Ht 62.0 in | Wt 159.2 lb

## 2015-07-10 DIAGNOSIS — E039 Hypothyroidism, unspecified: Secondary | ICD-10-CM

## 2015-07-10 DIAGNOSIS — H9193 Unspecified hearing loss, bilateral: Secondary | ICD-10-CM

## 2015-07-10 DIAGNOSIS — Z0000 Encounter for general adult medical examination without abnormal findings: Secondary | ICD-10-CM | POA: Diagnosis not present

## 2015-07-10 DIAGNOSIS — E785 Hyperlipidemia, unspecified: Secondary | ICD-10-CM | POA: Diagnosis not present

## 2015-07-10 DIAGNOSIS — I10 Essential (primary) hypertension: Secondary | ICD-10-CM

## 2015-07-10 DIAGNOSIS — Z780 Asymptomatic menopausal state: Secondary | ICD-10-CM

## 2015-07-10 DIAGNOSIS — R300 Dysuria: Secondary | ICD-10-CM

## 2015-07-10 LAB — CBC WITH DIFFERENTIAL/PLATELET
Basophils Absolute: 0 10*3/uL (ref 0.0–0.1)
Basophils Relative: 1 % (ref 0.0–3.0)
EOS ABS: 0.2 10*3/uL (ref 0.0–0.7)
Eosinophils Relative: 5.2 % — ABNORMAL HIGH (ref 0.0–5.0)
HEMATOCRIT: 37.7 % (ref 36.0–46.0)
Hemoglobin: 12.5 g/dL (ref 12.0–15.0)
LYMPHS ABS: 1.3 10*3/uL (ref 0.7–4.0)
Lymphocytes Relative: 38.1 % (ref 12.0–46.0)
MCHC: 33.1 g/dL (ref 30.0–36.0)
MCV: 100 fl (ref 78.0–100.0)
Monocytes Absolute: 0.5 10*3/uL (ref 0.1–1.0)
Monocytes Relative: 14.4 % — ABNORMAL HIGH (ref 3.0–12.0)
NEUTROS PCT: 41.3 % — AB (ref 43.0–77.0)
Neutro Abs: 1.4 10*3/uL (ref 1.4–7.7)
Platelets: 174 10*3/uL (ref 150.0–400.0)
RBC: 3.77 Mil/uL — AB (ref 3.87–5.11)
RDW: 14 % (ref 11.5–15.5)
WBC: 3.3 10*3/uL — AB (ref 4.0–10.5)

## 2015-07-10 LAB — POCT URINALYSIS DIPSTICK
Glucose, UA: NEGATIVE
Ketones, UA: NEGATIVE
Nitrite, UA: NEGATIVE
PH UA: 8.5
Spec Grav, UA: <=1.005
Urobilinogen, UA: 0.2

## 2015-07-10 LAB — LIPID PANEL
CHOL/HDL RATIO: 3
Cholesterol: 221 mg/dL — ABNORMAL HIGH (ref 0–200)
HDL: 79 mg/dL (ref >39.00)
LDL Cholesterol: 128 mg/dL — ABNORMAL HIGH (ref 0–99)
NonHDL: 142
Triglycerides: 68 mg/dL (ref 0.0–149.0)
VLDL: 13.6 mg/dL (ref 0.0–40.0)

## 2015-07-10 LAB — HEPATIC FUNCTION PANEL
ALT: 11 U/L (ref 0–35)
AST: 15 U/L (ref 0–37)
Albumin: 3.9 g/dL (ref 3.5–5.2)
Alkaline Phosphatase: 139 U/L — ABNORMAL HIGH (ref 39–117)
Bilirubin, Direct: 0.1 mg/dL (ref 0.0–0.3)
Total Bilirubin: 0.6 mg/dL (ref 0.2–1.2)
Total Protein: 7 g/dL (ref 6.0–8.3)

## 2015-07-10 LAB — BASIC METABOLIC PANEL
BUN: 16 mg/dL (ref 6–23)
CO2: 30 mEq/L (ref 19–32)
Calcium: 9.4 mg/dL (ref 8.4–10.5)
Chloride: 105 mEq/L (ref 96–112)
Creatinine, Ser: 0.91 mg/dL (ref 0.40–1.20)
GFR: 62.05 mL/min (ref >60.00)
Glucose, Bld: 110 mg/dL — ABNORMAL HIGH (ref 70–99)
Potassium: 5 mEq/L (ref 3.5–5.1)
Sodium: 141 mEq/L (ref 135–145)

## 2015-07-10 LAB — TSH: TSH: 0.55 u[IU]/mL (ref 0.35–4.50)

## 2015-07-10 NOTE — Progress Notes (Signed)
Subjective:    Patient ID: Amanda Long, female    DOB: Sep 05, 1925, 81 y.o.   MRN: [id number]  HPI  Amanda Long is a 81 year old female former smoker, patient of Dr. Sherren Mocha who comes in for her Medicare wellness examination.  She has a history of hypertension is on Norvasc 5 mg and beta blocker 50 mg daily day. BP today 120/78 .  Her other medicines reviewed the been no changes.  She gets routine eye care, dental care, BSE monthly, and yearly mammography, previous colonoscopies normal in 2013.  Cognitive function normal she does stretching exercises and walks on a daily basis home health safety reviewed no issues identified, no guns in the house, she does have a health care power of attorney and living well  Vaccinations UTD  She denies any interval history   Medicare Wellness questionnaire reviewed .   Review of Systems  Constitutional: Negative.   HENT: Negative.   Eyes: Negative.   Respiratory: Negative.   Cardiovascular: Negative.   Gastrointestinal: Positive for constipation (from medication).  Endocrine: Negative.   Genitourinary: Negative.   Musculoskeletal: Negative.   Skin: Negative.   Allergic/Immunologic: Negative.   Neurological: Negative.   Psychiatric/Behavioral: Negative.   All other systems reviewed and are negative.  Past Medical History  Diagnosis Date  . GERD (gastroesophageal reflux disease)   . Hyperlipidemia   . Hypertension   . Peripheral neuropathy   . Breast cancer     Right 1985 and 1989  . Duodenitis without mention of hemorrhage   . Unspecified gastritis and gastroduodenitis without mention of hemorrhage   . History of colon polyps   . Diverticulosis of colon (without mention of hemorrhage)   . Internal hemorrhoids without mention of complication   . Blood transfusion     1950    History   Social History  . Marital Status: Widowed    Spouse Name: [name]  . Number of Children: [name]  . Years of Education: [name]   Occupational History    . retired Corporate treasurer    Social History Main Topics  . Smoking status: Former Smoker -- 0.50 packs/day for 20 years    Types: Cigarettes    Quit date: 12/15/1983  . Smokeless tobacco: Never Used  . Alcohol Use: No  . Drug Use: No  . Sexual Activity: Not on file   Other Topics Concern  . Not on file   Social History Narrative   Regular exercise - yes    Past Surgical History  Procedure Laterality Date  . Total abdominal hysterectomy    . Tonsillectomy    . Mastectomy      right  . Appendectomy      Family History  Problem Relation Age of Onset  . Coronary artery disease      fhx  . Hypertension      fhx  . Glaucoma      fhx  . Colon cancer Neg Hx   . Cervical cancer Sister     Allergies  Allergen Reactions  . Codeine     REACTION: itching/vomiting  . Dilaudid [Hydromorphone Hcl]   . Meperidine Hcl     REACTION: resp. distress  . Penicillins     REACTION: hives    Current Outpatient Prescriptions on File Prior to Visit  Medication Sig Dispense Refill  . amLODipine (NORVASC) 5 MG tablet Take 1 tablet (5 mg total) by mouth daily. 100 tablet 0  . aspirin 81 MG EC tablet Take 81 mg  by mouth daily.      . Calcium Carbonate-Vitamin D (CALCIUM 600+D) 600-400 MG-UNIT per tablet Twice weekly    . carbamazepine (TEGRETOL XR) 100 MG 12 hr tablet 2  tablet twice daily 360 tablet 3  . Docusate Calcium (STOOL SOFTENER PO) Take by mouth as needed.    . furosemide (LASIX) 20 MG tablet Take 1 tablet (20 mg total) by mouth daily. 100 tablet 0  . ibuprofen (ADVIL,MOTRIN) 200 MG tablet Take 200 mg by mouth every 6 (six) hours as needed.      . metoprolol succinate (TOPROL-XL) 100 MG 24 hr tablet One half tablet daily 45 tablet 3  . omeprazole (PRILOSEC) 20 MG capsule Take 1 capsule (20 mg total) by mouth daily as needed. 100 capsule 3  . pravastatin (PRAVACHOL) 20 MG tablet Take 1 tablet (20 mg total) by mouth at bedtime. 100 tablet 0  . vitamin E 400 UNIT capsule Twice a week      No current facility-administered medications on file prior to visit.    BP 120/78 mmHg  Temp(Src) 97.9 F (36.6 C) (Oral)  Ht 5\' 2"  (1.575 m)  Wt 159 lb 3.2 oz (72.213 kg)  BMI 29.11 kg/m2       Objective:   Physical Exam  Constitutional: She is oriented to person, place, and time. She appears well-developed and well-nourished.  HENT:  Head: Normocephalic and atraumatic.  Right Ear: External ear normal.  Left Ear: External ear normal.  Nose: Nose normal.  Mouth/Throat: Oropharynx is clear and moist.  TM's visualized. No cerumen impaction. Wears hearing aids but does not have them today.  Eyes: Conjunctivae are normal. Pupils are equal, round, and reactive to light. Right eye exhibits no discharge. Left eye exhibits no discharge. No scleral icterus.  Wearing glasses  Neck: Normal range of motion. Neck supple. No thyromegaly present.  Cardiovascular: Normal rate, regular rhythm, normal heart sounds and intact distal pulses.  Exam reveals no gallop and no friction rub.   No murmur heard. No carotid bruit  Pulmonary/Chest: Effort normal and breath sounds normal. No respiratory distress. She has no wheezes. She has no rales. She exhibits no tenderness.  Abdominal: Soft. Bowel sounds are normal. She exhibits no distension. There is no tenderness. There is no rebound and no guarding.  Genitourinary:  Deferred breast exam  Musculoskeletal: Normal range of motion. She exhibits no edema or tenderness.  Lymphadenopathy:    She has no cervical adenopathy.  Neurological: She is alert and oriented to person, place, and time. She has normal reflexes.  Skin: Skin is warm and dry. No rash noted. No erythema. No pallor.  Psychiatric: She has a normal mood and affect. Her behavior is normal. Judgment and thought content normal.  Vitals reviewed.      Assessment & Plan:  1. Encounter for Medicare annual wellness exam - Follow up in one year for MWE - Follow up sooner if needed -  Continue to exercise and eat healthy.  - Will follow up with labs once they result.   2. Essential hypertension - EKG 12-Lead- Sinus Bradycardia, Rate 47. This is normal for patient.  - Controlled with current medications.   3. Hearing loss, bilateral - She will schedule follow up with her physician for hearing exam.   4. Postmenopausal - DG Bone Density; Future

## 2015-07-10 NOTE — Patient Instructions (Signed)
It was great seeing you again.   I will follow up with you regarding your blood work.   Follow up with your hearing doctor.   Follow up with Dr. Sherren Mocha in one year or sooner if needed.

## 2015-07-11 ENCOUNTER — Telehealth: Payer: Self-pay | Admitting: Adult Health

## 2015-07-11 ENCOUNTER — Other Ambulatory Visit: Payer: Self-pay | Admitting: Adult Health

## 2015-07-11 MED ORDER — CIPROFLOXACIN HCL 250 MG PO TABS: 250.0000 mg | ORAL_TABLET | Freq: Two times a day (BID) | ORAL | Status: DC

## 2015-07-11 NOTE — Addendum Note (Signed)
Addended by: Apolinar Junes on: 07/11/2015 07:45 AM   Modules accepted: Level of Service

## 2015-07-11 NOTE — Addendum Note (Signed)
Addended by: Apolinar Junes on: 07/11/2015 08:09 AM   Modules accepted: Level of Service

## 2015-07-11 NOTE — Telephone Encounter (Signed)
Spoke to patient on the phone and informed her of her lab results. She endorsed having burning with urination this morning. Sent in Cipro 250 mg BID x 3 days.

## 2015-07-13 LAB — URINE CULTURE: Colony Count: >=100000

## 2015-08-02 ENCOUNTER — Encounter: Payer: Self-pay | Admitting: Gastroenterology

## 2015-09-03 ENCOUNTER — Telehealth: Payer: Self-pay | Admitting: Family Medicine

## 2015-09-03 DIAGNOSIS — H9190 Unspecified hearing loss, unspecified ear: Secondary | ICD-10-CM

## 2015-09-03 DIAGNOSIS — Z853 Personal history of malignant neoplasm of breast: Secondary | ICD-10-CM

## 2015-09-03 DIAGNOSIS — G609 Hereditary and idiopathic neuropathy, unspecified: Secondary | ICD-10-CM

## 2015-09-03 DIAGNOSIS — G500 Trigeminal neuralgia: Secondary | ICD-10-CM

## 2015-09-03 DIAGNOSIS — K219 Gastro-esophageal reflux disease without esophagitis: Secondary | ICD-10-CM

## 2015-09-03 DIAGNOSIS — I10 Essential (primary) hypertension: Secondary | ICD-10-CM

## 2015-09-03 DIAGNOSIS — J8410 Pulmonary fibrosis, unspecified: Secondary | ICD-10-CM

## 2015-09-03 DIAGNOSIS — E785 Hyperlipidemia, unspecified: Secondary | ICD-10-CM

## 2015-09-03 MED ORDER — AMLODIPINE BESYLATE 5 MG PO TABS: 5.0000 mg | ORAL_TABLET | Freq: Every day | ORAL | Status: DC

## 2015-09-03 NOTE — Telephone Encounter (Signed)
Pt request refill of the following: amLODipine (NORVASC) 5 MG tablet  Pt said she think it is time for all her meds to be refill and if so she would like to pick them all up at the same time      Phamacy: [facility] [address]

## 2015-09-05 ENCOUNTER — Telehealth: Payer: Self-pay | Admitting: Family Medicine

## 2015-09-05 DIAGNOSIS — G500 Trigeminal neuralgia: Secondary | ICD-10-CM

## 2015-09-05 DIAGNOSIS — Z853 Personal history of malignant neoplasm of breast: Secondary | ICD-10-CM

## 2015-09-05 DIAGNOSIS — J8410 Pulmonary fibrosis, unspecified: Secondary | ICD-10-CM

## 2015-09-05 DIAGNOSIS — G609 Hereditary and idiopathic neuropathy, unspecified: Secondary | ICD-10-CM

## 2015-09-05 DIAGNOSIS — K219 Gastro-esophageal reflux disease without esophagitis: Secondary | ICD-10-CM

## 2015-09-05 DIAGNOSIS — E785 Hyperlipidemia, unspecified: Secondary | ICD-10-CM

## 2015-09-05 DIAGNOSIS — H9190 Unspecified hearing loss, unspecified ear: Secondary | ICD-10-CM

## 2015-09-05 DIAGNOSIS — I10 Essential (primary) hypertension: Secondary | ICD-10-CM

## 2015-09-05 MED ORDER — METOPROLOL SUCCINATE ER 100 MG PO TB24: ORAL_TABLET | ORAL | Status: DC

## 2015-09-05 MED ORDER — PRAVASTATIN SODIUM 20 MG PO TABS: 20.0000 mg | ORAL_TABLET | Freq: Every day | ORAL | Status: AC

## 2015-09-05 NOTE — Telephone Encounter (Signed)
Pt request refill of the following: pravastatin (PRAVACHOL) 20 MG tablet , metoprolol succinate (TOPROL-XL) 100 MG 24 hr tablet ,   Phamacy: [facility]

## 2015-09-05 NOTE — Telephone Encounter (Signed)
Rx sent 

## 2015-09-06 ENCOUNTER — Other Ambulatory Visit: Payer: Self-pay | Admitting: *Deleted

## 2015-09-06 DIAGNOSIS — H9190 Unspecified hearing loss, unspecified ear: Secondary | ICD-10-CM

## 2015-09-06 DIAGNOSIS — G500 Trigeminal neuralgia: Secondary | ICD-10-CM

## 2015-09-06 DIAGNOSIS — J8410 Pulmonary fibrosis, unspecified: Secondary | ICD-10-CM

## 2015-09-06 DIAGNOSIS — E785 Hyperlipidemia, unspecified: Secondary | ICD-10-CM

## 2015-09-06 DIAGNOSIS — I10 Essential (primary) hypertension: Secondary | ICD-10-CM

## 2015-09-06 DIAGNOSIS — G609 Hereditary and idiopathic neuropathy, unspecified: Secondary | ICD-10-CM

## 2015-09-06 DIAGNOSIS — Z853 Personal history of malignant neoplasm of breast: Secondary | ICD-10-CM

## 2015-09-06 DIAGNOSIS — K219 Gastro-esophageal reflux disease without esophagitis: Secondary | ICD-10-CM

## 2015-09-06 MED ORDER — FUROSEMIDE 20 MG PO TABS: 20.0000 mg | ORAL_TABLET | Freq: Every day | ORAL | Status: AC

## 2015-11-19 ENCOUNTER — Encounter: Payer: Self-pay | Admitting: Family Medicine

## 2015-11-19 ENCOUNTER — Ambulatory Visit (INDEPENDENT_AMBULATORY_CARE_PROVIDER_SITE_OTHER): Payer: Medicare Other | Admitting: Family Medicine

## 2015-11-19 VITALS — BP 120/70 | HR 59 | Temp 97.5°F | Wt 157.4 lb

## 2015-11-19 DIAGNOSIS — G500 Trigeminal neuralgia: Secondary | ICD-10-CM

## 2015-11-19 DIAGNOSIS — H9193 Unspecified hearing loss, bilateral: Secondary | ICD-10-CM

## 2015-11-19 MED ORDER — GABAPENTIN 100 MG PO CAPS: ORAL_CAPSULE | ORAL | Status: AC

## 2015-11-19 NOTE — Patient Instructions (Addendum)
Gabapentin.. 100 mg...................... 2 tabs twice daily  Call the number for health team advantage........... the [facility] product  Tommi Rumps is now your primary caregiver. Make an appointment to see him in July for annual physical

## 2015-11-19 NOTE — Progress Notes (Signed)
   Subjective:    Patient ID: Amanda Long, female    DOB: [birth year], 81 y.o.   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 Georgina Snell did a physical examination on her in July. She was advised to go get hearing aids because of profound progressive deafness which she has not done.  She's on Tegretol 100 mg 2 tabs twice a day. She tells me her [employer] will no longer cover that medication   Review of Systems    review of systems otherwise negative Objective:   Physical Exam  Well-developed well-nourished female no acute distress she is profoundly deaf. I have to stand next her normal he L for her here      Assessment & Plan:  Profound deafness progressive,,,,,,,,,,,,,,, refer for audiologic evaluation  History of trigeminal neuralgia,,,,,,,, asymptomatic on Tegretol 200 mg twice a day,,,,,,, refill medication  Follow-up in July for your annual physical examination with Georgina Snell who is her new long-term caregiver,

## 2015-12-03 ENCOUNTER — Other Ambulatory Visit: Payer: Self-pay | Admitting: Adult Health

## 2015-12-03 DIAGNOSIS — Z853 Personal history of malignant neoplasm of breast: Secondary | ICD-10-CM

## 2015-12-03 DIAGNOSIS — H9190 Unspecified hearing loss, unspecified ear: Secondary | ICD-10-CM

## 2015-12-03 DIAGNOSIS — G500 Trigeminal neuralgia: Secondary | ICD-10-CM

## 2015-12-03 DIAGNOSIS — K219 Gastro-esophageal reflux disease without esophagitis: Secondary | ICD-10-CM

## 2015-12-03 DIAGNOSIS — E785 Hyperlipidemia, unspecified: Secondary | ICD-10-CM

## 2015-12-03 DIAGNOSIS — J8410 Pulmonary fibrosis, unspecified: Secondary | ICD-10-CM

## 2015-12-03 DIAGNOSIS — I10 Essential (primary) hypertension: Secondary | ICD-10-CM

## 2015-12-03 DIAGNOSIS — G609 Hereditary and idiopathic neuropathy, unspecified: Secondary | ICD-10-CM

## 2015-12-03 MED ORDER — AMLODIPINE BESYLATE 5 MG PO TABS: 5.0000 mg | ORAL_TABLET | Freq: Every day | ORAL | Status: DC

## 2016-02-03 ENCOUNTER — Other Ambulatory Visit: Payer: Self-pay

## 2016-02-03 DIAGNOSIS — Z1231 Encounter for screening mammogram for malignant neoplasm of breast: Secondary | ICD-10-CM

## 2016-03-09 ENCOUNTER — Ambulatory Visit
Admission: RE | Admit: 2016-03-09 | Discharge: 2016-03-09 | Disposition: A | Discharge status: Home / Self Care | Payer: Medicare Other | Source: Ambulatory Visit

## 2016-03-09 DIAGNOSIS — Z1231 Encounter for screening mammogram for malignant neoplasm of breast: Secondary | ICD-10-CM

## 2016-03-10 ENCOUNTER — Other Ambulatory Visit: Payer: Self-pay | Admitting: Adult Health

## 2016-03-19 ENCOUNTER — Telehealth: Payer: Self-pay | Admitting: Adult Health

## 2016-03-19 ENCOUNTER — Other Ambulatory Visit: Payer: Self-pay | Admitting: Family Medicine

## 2016-03-19 NOTE — Telephone Encounter (Signed)
Pt needs refill on tegretol 100 mg #120 w/refills call into [facility]

## 2016-03-19 NOTE — Telephone Encounter (Signed)
Ok to refill 

## 2016-03-19 NOTE — Telephone Encounter (Signed)
Ok to refill for 90 days. Needs an office visit after that

## 2016-03-20 ENCOUNTER — Other Ambulatory Visit: Payer: Self-pay

## 2016-03-20 MED ORDER — TEGRETOL-XR 100 MG PO TB12: 200.0000 mg | ORAL_TABLET | Freq: Two times a day (BID) | ORAL | Status: AC

## 2016-03-20 NOTE — Telephone Encounter (Signed)
Rx refilled.

## 2016-03-23 ENCOUNTER — Telehealth: Payer: Self-pay | Admitting: General Practice

## 2016-03-23 NOTE — Telephone Encounter (Signed)
Received a rejection from insurance on patients tegretol. Resubmitted today on CMM.

## 2016-04-13 ENCOUNTER — Encounter: Payer: Self-pay | Admitting: Adult Health

## 2016-04-13 ENCOUNTER — Ambulatory Visit (INDEPENDENT_AMBULATORY_CARE_PROVIDER_SITE_OTHER): Payer: Medicare Other | Admitting: Adult Health

## 2016-04-13 ENCOUNTER — Ambulatory Visit
Admission: RE | Admit: 2016-04-13 | Discharge: 2016-04-13 | Disposition: A | Discharge status: Home / Self Care | Payer: Medicare Other | Source: Ambulatory Visit | Attending: Adult Health | Admitting: Adult Health

## 2016-04-13 VITALS — BP 150/70 | HR 74 | Temp 98.0°F | Ht 65.0 in | Wt 161.0 lb

## 2016-04-13 DIAGNOSIS — R059 Cough, unspecified: Secondary | ICD-10-CM

## 2016-04-13 DIAGNOSIS — R05 Cough: Secondary | ICD-10-CM

## 2016-04-13 DIAGNOSIS — J8410 Pulmonary fibrosis, unspecified: Secondary | ICD-10-CM

## 2016-04-13 DIAGNOSIS — J209 Acute bronchitis, unspecified: Secondary | ICD-10-CM | POA: Insufficient documentation

## 2016-04-13 MED ORDER — AZITHROMYCIN 250 MG PO TABS: ORAL_TABLET | ORAL | Status: AC

## 2016-04-13 MED ORDER — PREDNISONE 10 MG PO TABS: ORAL_TABLET | ORAL | Status: DC

## 2016-04-13 NOTE — Progress Notes (Signed)
Subjective:    Patient ID: Amanda Long, female    DOB: 03-31-1925, 82 y.o.   MRN: [id number]  HPI 29F, remote smoker, with ILD, preserved lung function  Presented with cough & dyspnea since 2/10  She underwent lumpectomy & LN dissection in '85 but had skin recurrence & required mastectomy with breast implant in '89 followed by chemo-RT. Her serial CXRs show a haze over the lower Rt hemithorax due to the implant which have been misinterpreted as pneumonia.  She c/o recurrent cough , a feeling of 'strangling' in her throat & progressive dyspnea on exertion. Zestril was stopped 05/02/09. Codeine syrup makes her nauseous but she can tolerate this at night. Prilosec used for GERD, no symptoms of heartburn.  CT chest 6/10 >> mediastinal lymphadenopathy, extensive fibrosis & honeycombing BLL & pulmonary arterial dilation.  7/10 >>COugh better with prednisone & tessalon.ANA, RA neg, ESR 74  PFTs 8/13 >> lung volumes preserved, DLCO 47% stable compared to 6//10   04/13/2016 Acute OV :  Pt presents for an acute office visit. Seen last in office 2015 . Says doing okay until 1 week ago.  Complains of dry cough, SOB with activity,  chest congestion/tightness. Fever and chills at times for 1 week.  . Cough is bad at times. Says she coughs up mucus but hard to get up . Denies chest pain,orthopnea, edema or fever.Pnuemovax and Flu shot is up-to-date. Taking Delsym for cough    Past Medical History  Diagnosis Date  . GERD (gastroesophageal reflux disease)   . Hyperlipidemia   . Hypertension   . Peripheral neuropathy ([place])   . Breast cancer ([place])     Right 1985 and 1989  . Duodenitis without mention of hemorrhage   . Unspecified gastritis and gastroduodenitis without mention of hemorrhage   . History of colon polyps   . Diverticulosis of colon (without mention of hemorrhage)   . Internal hemorrhoids without mention of complication   . Blood transfusion     1950    Current Outpatient Prescriptions  on File Prior to Visit  Medication Sig Dispense Refill  . amLODipine (NORVASC) 5 MG tablet TAKE ONE TABLET BY MOUTH ONCE DAILY 100 tablet 0  . aspirin 81 MG EC tablet Take 81 mg by mouth daily.      . Calcium Carbonate-Vitamin D (CALCIUM 600+D) 600-400 MG-UNIT per tablet Twice weekly    . ciprofloxacin (CIPRO) 250 MG tablet Take 1 tablet (250 mg total) by mouth 2 (two) times daily. 6 tablet 0  . Docusate Calcium (STOOL SOFTENER PO) Take by mouth as needed.    . furosemide (LASIX) 20 MG tablet Take 1 tablet (20 mg total) by mouth daily. 100 tablet 3  . gabapentin (NEURONTIN) 100 MG capsule 2 tabs twice daily 400 capsule 3  . ibuprofen (ADVIL,MOTRIN) 200 MG tablet Take 200 mg by mouth every 6 (six) hours as needed.      . metoprolol succinate (TOPROL-XL) 100 MG 24 hr tablet One half tablet daily 45 tablet 3  . omeprazole (PRILOSEC) 20 MG capsule Take 1 capsule (20 mg total) by mouth daily as needed. 100 capsule 3  . pravastatin (PRAVACHOL) 20 MG tablet Take 1 tablet (20 mg total) by mouth at bedtime. 90 tablet 3  . TEGRETOL-XR 100 MG 12 hr tablet Take 2 tablets (200 mg total) by mouth 2 (two) times daily. **NEEDS O/V FOR FURTHER REFILLS** 120 tablet 2  . vitamin E 400 UNIT capsule Twice a week  No current facility-administered medications on file prior to visit.       Review of Systems neg for any significant sore throat, dysphagia, itching, sneezing, nasal congestion or excess/ purulent secretions, fever, chills, sweats, unintended wt loss, pleuritic or exertional cp, hempoptysis, orthopnea pnd or change in chronic leg swelling. Also denies presyncope, palpitations, heartburn, abdominal pain, nausea, vomiting, diarrhea or change in bowel or urinary habits, dysuria,hematuria, rash, arthralgias, visual complaints, headache, numbness weakness or ataxia.     Objective:   Physical Exam Filed Vitals:   04/13/16 1555  BP: 150/70  Pulse: 74  Temp: 98 F (36.7 C)  TempSrc: Oral  Height:  _0  (1.651 m)  Weight: 161 lb (73.029 kg)  SpO2: 98%    Gen. Pleasant, elderly , in no distress ENT - no lesions, no post nasal drip Neck: No JVD, no thyromegaly, no carotid bruits Lungs: no use of accessory muscles, no dullness to percussion, bibasal rales, no rhonchi  Cardiovascular: Rhythm regular, heart sounds  normal, no murmurs or gallops, 1+  peripheral edema Musculoskeletal: No deformities, no cyanosis or clubbing   Tammy Parrett NP-C  [facility]  04/13/2016      Assessment & Plan:

## 2016-04-13 NOTE — Assessment & Plan Note (Signed)
Mild flare with URI   Plan   Check Chest xray today .  Zpack take as directed.  Prednisone taper over next week .  Mucinex DM Twice daily  As needed  Cough/congestion .  Please contact office for sooner follow up if symptoms do not improve or worsen or seek emergency care  Follow up with Dr. Elsworth Soho  In 3-53months and .pnr

## 2016-04-13 NOTE — Assessment & Plan Note (Addendum)
Flare   Plan  Check Chest xray today .  Zpack take as directed.  Prednisone taper over next week .  Mucinex DM Twice daily  As needed  Cough/congestion .  Please contact office for sooner follow up if symptoms do not improve or worsen or seek emergency care  Follow up with Dr. Elsworth Soho  In 3-25months and As needed

## 2016-04-13 NOTE — Patient Instructions (Addendum)
Check Chest xray today .  Zpack take as directed.  Prednisone taper over next week .  Mucinex DM Twice daily  As needed  Cough/congestion .  Please contact office for sooner follow up if symptoms do not improve or worsen or seek emergency care  Follow up with Dr. Elsworth Soho  In 3-55months and As needed

## 2016-04-19 ENCOUNTER — Encounter (HOSPITAL_COMMUNITY): Payer: Self-pay | Admitting: Emergency Medicine

## 2016-04-19 ENCOUNTER — Inpatient Hospital Stay (HOSPITAL_COMMUNITY)
Admission: EM | Admit: 2016-04-19 | Discharge: 2016-04-23 | DRG: 871 | Disposition: A | Discharge status: Skilled Nursing Facility | Payer: Medicare Other | Attending: Internal Medicine | Admitting: Internal Medicine

## 2016-04-19 DIAGNOSIS — A4151 Sepsis due to Escherichia coli [E. coli]: Principal | ICD-10-CM | POA: Diagnosis present

## 2016-04-19 DIAGNOSIS — N390 Urinary tract infection, site not specified: Secondary | ICD-10-CM

## 2016-04-19 DIAGNOSIS — G4700 Insomnia, unspecified: Secondary | ICD-10-CM | POA: Diagnosis present

## 2016-04-19 DIAGNOSIS — K5900 Constipation, unspecified: Secondary | ICD-10-CM | POA: Diagnosis present

## 2016-04-19 DIAGNOSIS — I10 Essential (primary) hypertension: Secondary | ICD-10-CM | POA: Diagnosis present

## 2016-04-19 DIAGNOSIS — J9691 Respiratory failure, unspecified with hypoxia: Secondary | ICD-10-CM | POA: Diagnosis present

## 2016-04-19 DIAGNOSIS — J8410 Pulmonary fibrosis, unspecified: Secondary | ICD-10-CM | POA: Diagnosis present

## 2016-04-19 DIAGNOSIS — E785 Hyperlipidemia, unspecified: Secondary | ICD-10-CM | POA: Diagnosis present

## 2016-04-19 DIAGNOSIS — J189 Pneumonia, unspecified organism: Secondary | ICD-10-CM | POA: Diagnosis present

## 2016-04-19 DIAGNOSIS — Z885 Allergy status to narcotic agent status: Secondary | ICD-10-CM

## 2016-04-19 DIAGNOSIS — Z7982 Long term (current) use of aspirin: Secondary | ICD-10-CM

## 2016-04-19 DIAGNOSIS — Z79899 Other long term (current) drug therapy: Secondary | ICD-10-CM

## 2016-04-19 DIAGNOSIS — F419 Anxiety disorder, unspecified: Secondary | ICD-10-CM | POA: Diagnosis present

## 2016-04-19 DIAGNOSIS — E876 Hypokalemia: Secondary | ICD-10-CM | POA: Diagnosis present

## 2016-04-19 DIAGNOSIS — Z880 Allergy status to penicillin: Secondary | ICD-10-CM

## 2016-04-19 DIAGNOSIS — J811 Chronic pulmonary edema: Secondary | ICD-10-CM | POA: Diagnosis present

## 2016-04-19 DIAGNOSIS — Z853 Personal history of malignant neoplasm of breast: Secondary | ICD-10-CM

## 2016-04-19 DIAGNOSIS — Z87891 Personal history of nicotine dependence: Secondary | ICD-10-CM

## 2016-04-19 DIAGNOSIS — N179 Acute kidney failure, unspecified: Secondary | ICD-10-CM | POA: Diagnosis present

## 2016-04-19 DIAGNOSIS — G629 Polyneuropathy, unspecified: Secondary | ICD-10-CM | POA: Diagnosis present

## 2016-04-19 DIAGNOSIS — B348 Other viral infections of unspecified site: Secondary | ICD-10-CM | POA: Diagnosis present

## 2016-04-19 DIAGNOSIS — R6521 Severe sepsis with septic shock: Secondary | ICD-10-CM | POA: Diagnosis present

## 2016-04-19 DIAGNOSIS — Z888 Allergy status to other drugs, medicaments and biological substances status: Secondary | ICD-10-CM

## 2016-04-19 DIAGNOSIS — R739 Hyperglycemia, unspecified: Secondary | ICD-10-CM | POA: Diagnosis present

## 2016-04-19 DIAGNOSIS — K219 Gastro-esophageal reflux disease without esophagitis: Secondary | ICD-10-CM | POA: Diagnosis present

## 2016-04-19 DIAGNOSIS — A419 Sepsis, unspecified organism: Secondary | ICD-10-CM | POA: Diagnosis present

## 2016-04-19 HISTORY — DX: Pulmonary fibrosis, unspecified: J84.10

## 2016-04-19 MED ORDER — LEVOFLOXACIN IN D5W 750 MG/150ML IV SOLN
750.0000 mg | Freq: Once | INTRAVENOUS | Status: AC
  Administered 2016-04-20: 750 mg via INTRAVENOUS
  Filled 2016-04-19: qty 150

## 2016-04-19 MED ORDER — SODIUM CHLORIDE 0.9 % IV BOLUS (SEPSIS)
500.0000 mL | Freq: Once | INTRAVENOUS | Status: AC
  Administered 2016-04-20: 500 mL via INTRAVENOUS

## 2016-04-19 MED ORDER — SODIUM CHLORIDE 0.9 % IV BOLUS (SEPSIS)
1000.0000 mL | Freq: Once | INTRAVENOUS | Status: AC
  Administered 2016-04-20: 1000 mL via INTRAVENOUS

## 2016-04-19 MED ORDER — SODIUM CHLORIDE 0.9 % IV BOLUS (SEPSIS)
1000.0000 mL | Freq: Once | INTRAVENOUS | Status: AC
  Administered 2016-04-19: 1000 mL via INTRAVENOUS

## 2016-04-19 MED ORDER — VANCOMYCIN HCL IN DEXTROSE 1-5 GM/200ML-% IV SOLN
1000.0000 mg | Freq: Once | INTRAVENOUS | Status: AC
  Administered 2016-04-20: 1000 mg via INTRAVENOUS
  Filled 2016-04-19: qty 200

## 2016-04-19 NOTE — ED Notes (Signed)
PER [facility]: Patient to ED from home c/o generalized weakness and productive cough x 2 weeks. 101.5 fever with EMS - gave 1000mg  Tylenol PTA. Pt is on Lasix - takes as prescribed, no edema noted. 18g. LAC. EMS VS: 100 palp SBP, HR 88, 97% on 2L O2 nasal cannula (93% on RA), CBG 148. Patient A&O x 4.

## 2016-04-19 NOTE — ED Notes (Signed)
Code Sepsis called @ 2339

## 2016-04-19 NOTE — ED Provider Notes (Signed)
CSN: [id number]     Arrival date & time 04/19/16  2317 History  By signing my name below, I, Altamease Oiler, attest that this documentation has been prepared under the direction and in the presence of Orpah Greek, MD. Electronically Signed: Altamease Oiler, ED Scribe. 04/20/2016. 3:02 AM   Chief Complaint  Patient presents with  . Code Sepsis    The history is provided by the patient and the EMS personnel. No language interpreter was used.   Brought in by EMS, Amanda Long is a 82 y.o. female with PMHx of HTN, HLD, breast cancer s/p mastectomy who presents to the Emergency Department complaining of generalized weakness with onset 3 days ago. Associated symptoms include chills since yesterday and a cough productive of thick white mucous for 2-3 weeks. She was noted to have a fever up to 101 F (temporal) and was given 1000 mg of Tylenol.   Past Medical History  Diagnosis Date  . GERD (gastroesophageal reflux disease)   . Hyperlipidemia   . Hypertension   . Peripheral neuropathy ([place])   . Breast cancer ([place])     Right 1985 and 1989  . Duodenitis without mention of hemorrhage   . Unspecified gastritis and gastroduodenitis without mention of hemorrhage   . History of colon polyps   . Diverticulosis of colon (without mention of hemorrhage)   . Internal hemorrhoids without mention of complication   . Blood transfusion     1950   Past Surgical History  Procedure Laterality Date  . Total abdominal hysterectomy    . Tonsillectomy    . Mastectomy      right  . Appendectomy     Family History  Problem Relation Age of Onset  . Coronary artery disease      fhx  . Hypertension      fhx  . Glaucoma      fhx  . Colon cancer Neg Hx   . Cervical cancer Sister    Social History  Substance Use Topics  . Smoking status: Former Smoker -- 0.50 packs/day for 20 years    Types: Cigarettes    Quit date: 12/15/1983  . Smokeless tobacco: Never Used  . Alcohol Use: No   OB  History    No data available     Review of Systems  Constitutional: Positive for fever and chills.  Respiratory: Positive for cough.   Neurological: Positive for weakness.  All other systems reviewed and are negative.     Allergies  Codeine; Dilaudid; Meperidine hcl; and Penicillins  Home Medications   Prior to Admission medications   Medication Sig Start Date End Date Taking? Authorizing Provider  amLODipine (NORVASC) 5 MG tablet TAKE ONE TABLET BY MOUTH ONCE DAILY 03/10/16  Yes Dorothyann Peng, NP  aspirin 81 MG EC tablet Take 81 mg by mouth daily.     Yes Historical Provider, MD  Calcium Carbonate-Vitamin D (CALCIUM 600+D) 600-400 MG-UNIT per tablet Take 1 tablet by mouth 2 (two) times a week.    Yes Historical Provider, MD  furosemide (LASIX) 20 MG tablet Take 1 tablet (20 mg total) by mouth daily. 09/06/15  Yes Dorena Cookey, MD  gabapentin (NEURONTIN) 100 MG capsule 2 tabs twice daily Patient taking differently: Take 200 mg by mouth 2 (two) times daily.  11/19/15  Yes Dorena Cookey, MD  ibuprofen (ADVIL,MOTRIN) 200 MG tablet Take 200 mg by mouth every 6 (six) hours as needed for fever or mild pain.  Yes Historical Provider, MD  metoprolol succinate (TOPROL-XL) 100 MG 24 hr tablet One half tablet daily Patient taking differently: Take 50 mg by mouth daily.  09/05/15  Yes Dorena Cookey, MD  omeprazole (PRILOSEC) 20 MG capsule Take 1 capsule (20 mg total) by mouth daily as needed. 03/05/14  Yes Dorena Cookey, MD  pravastatin (PRAVACHOL) 20 MG tablet Take 1 tablet (20 mg total) by mouth at bedtime. 09/05/15  Yes Dorena Cookey, MD  predniSONE (DELTASONE) 10 MG tablet 4 tabs for 2 days, then 3 tabs for 2 days, 2 tabs for 2 days, then 1 tab for 2 days, then stop 04/13/16  Yes Tammy S Parrett, NP  TEGRETOL-XR 100 MG 12 hr tablet Take 2 tablets (200 mg total) by mouth 2 (two) times daily. **NEEDS O/V FOR FURTHER REFILLS** 03/20/16  Yes Dorothyann Peng, NP  vitamin E 400 UNIT capsule Take 400  Units by mouth 2 (two) times daily.    Yes Historical Provider, MD  ciprofloxacin (CIPRO) 250 MG tablet Take 1 tablet (250 mg total) by mouth 2 (two) times daily. Patient not taking: Reported on 04/20/2016 07/11/15   Dorothyann Peng, NP   BP 96/49 mmHg  Pulse 84  Temp(Src) 98.6 F (37 C) (Oral)  Resp 24  Ht 5\' 6"  (1.676 m)  Wt 161 lb (73.029 kg)  BMI 26.00 kg/m2  SpO2 98% Physical Exam  Constitutional: She is oriented to person, place, and time. She appears well-developed and well-nourished. No distress.  HENT:  Head: Normocephalic and atraumatic.  Right Ear: Hearing normal.  Left Ear: Hearing normal.  Nose: Nose normal.  Mouth/Throat: Oropharynx is clear and moist and mucous membranes are normal.  Eyes: Conjunctivae and EOM are normal. Pupils are equal, round, and reactive to light.  Neck: Normal range of motion. Neck supple.  Cardiovascular: Regular rhythm, S1 normal and S2 normal.  Exam reveals no gallop and no friction rub.   No murmur heard. Pulmonary/Chest: Effort normal. No respiratory distress. She has decreased breath sounds. She exhibits no tenderness.  Decreased breath sounds with crackles at the bases (L>R)  Abdominal: Soft. Normal appearance and bowel sounds are normal. There is no hepatosplenomegaly. There is no tenderness. There is no rebound, no guarding, no tenderness at McBurney's point and negative Murphy's sign. No hernia.  Musculoskeletal: Normal range of motion.  Neurological: She is alert and oriented to person, place, and time. She has normal strength. No cranial nerve deficit or sensory deficit. Coordination normal. GCS eye subscore is 4. GCS verbal subscore is 5. GCS motor subscore is 6.  Skin: Skin is warm, dry and intact. No rash noted. No cyanosis.  Psychiatric: She has a normal mood and affect. Her speech is normal and behavior is normal. Thought content normal.  Nursing note and vitals reviewed.   ED Course  Procedures (including critical care  time) COORDINATION OF CARE: 11:26 PM Discussed treatment plan which includes lab work, CXR, EKG, IVF and abx with pt at bedside and pt agreed to plan.  2:57 AM-Consult complete with Dr. Corrie Dandy (Critical Care). Patient case explained and discussed.  Call ended at 3:01 AM  Labs Review Labs Reviewed  COMPREHENSIVE METABOLIC PANEL - Abnormal; Notable for the following:    Potassium 3.1 (*)    CO2 20 (*)    Glucose, Bld 158 (*)    BUN 31 (*)    Creatinine, Ser 1.73 (*)    Calcium 8.3 (*)    Total Protein 6.4 (*)  Albumin 3.0 (*)    GFR calc non Af Amer 25 (*)    GFR calc Af Amer 29 (*)    All other components within normal limits  CBC WITH DIFFERENTIAL/PLATELET - Abnormal; Notable for the following:    WBC 14.5 (*)    RBC 3.64 (*)    Hemoglobin 11.5 (*)    HCT 35.2 (*)    Neutro Abs 13.2 (*)    All other components within normal limits  URINALYSIS, ROUTINE W REFLEX MICROSCOPIC (NOT AT [facility]) - Abnormal; Notable for the following:    APPearance CLOUDY (*)    Hgb urine dipstick LARGE (*)    Protein, ur 100 (*)    Leukocytes, UA SMALL (*)    All other components within normal limits  URINE MICROSCOPIC-ADD ON - Abnormal; Notable for the following:    Squamous Epithelial / LPF 0-5 (*)    Bacteria, UA FEW (*)    Casts HYALINE CASTS (*)    All other components within normal limits  I-STAT CG4 LACTIC ACID, ED - Abnormal; Notable for the following:    Lactic Acid, Venous 3.03 (*)    All other components within normal limits  CULTURE, BLOOD (ROUTINE X 2)  CULTURE, BLOOD (ROUTINE X 2)  URINE CULTURE  I-STAT CG4 LACTIC ACID, ED    Imaging Review Dg Chest 2 View  04/20/2016  CLINICAL DATA:  Sepsis EXAM: CHEST  2 VIEW COMPARISON:  04/13/2016 FINDINGS: There is unchanged moderate cardiomegaly. There is consolidation in the left base which may represent pneumonia. No large effusion. Severe emphysematous and fibrotic disease is present throughout both lungs. IMPRESSION: Probable left  lower lobe pneumonia superimposed on severe COPD. Followup PA and lateral chest X-ray is recommended in 3-4 weeks following trial of antibiotic therapy to ensure resolution and exclude underlying malignancy. Electronically Signed   By: Andreas Newport M.D.   On: 04/20/2016 00:26   I have personally reviewed and evaluated these images and lab results as part of my medical decision-making.   EKG Interpretation   Date/Time:  Monday Apr 20 2016 00:23:37 EDT Ventricular Rate:  69 PR Interval:  192 QRS Duration: 96 QT Interval:  450 QTC Calculation: 482 R Axis:   55 Text Interpretation:  Sinus rhythm Probable left ventricular hypertrophy  Minimal ST elevation, inferior leads No previous tracing Confirmed by  Klara Stjames  MD, Mylena Sedberry [phone]) on 04/20/2016 12:56:53 AM       Sepsis - Repeat Assessment  Performed at:    0200  Vitals     Blood pressure 88/39, pulse 89, temperature 98.6 F (37 C), temperature source Oral, resp. rate 27, height 5\' 6"  (1.676 m), weight 161 lb (73.029 kg), SpO2 100 %.  Heart:     Regular rate and rhythm  Lungs:    Rales and Rhonchi  Capillary Refill:   <2 sec  Peripheral Pulse:   Radial pulse palpable  Skin:     Normal Color    MDM   Final diagnoses:  CAP (community acquired pneumonia)  Sepsis, due to unspecified organism [facility])  UTI (lower urinary tract infection)    Patient presented to the ER for evaluation of generalized weakness. Patient reports that she has been experiencing cough and chest congestion for at least 3 weeks. Over the last 3 days, however, she has been feeling extremely weak and started having chills and sweats yesterday. She was noted to have a fever of 101 at home tonight. She took Tylenol prior to arrival. Examination revealed  rales and rhonchi in the left base. Chest x-ray is consistent with pneumonia in the left lower lobe. This is the source of the patient's sepsis.  Patient was hypotensive with increased respiratory rate.  She has a leukocytosis. Patient initiated on sepsis protocol. She was administered weight based fluid bolus of 30 mL/kg. Her blood pressure has not improved. She is, however, tolerating this hypotension without any mental status changes. Patient's initial lactate was elevated at 3.03. Patient was initiated on community acquired pneumonia empiric antibiotic coverage at arrival consisting of Levaquin and vancomycin (reports penicillin allergy).  Patient administered an additional liter of fluid after her 30 mL/kg bolus for persistent hypotension. Systolic blood pressure has marginally improved to 96. I discussed this with Dr. Corrie Dandy, on call for critical care. He did not feel that the patient would warrant IV pressors. He recommended admission to the stepdown unit by hospitalist, continue current treatment plan. Continue maintenance fluids.  CRITICAL CARE Performed by: Altamease Oiler   Total critical care time: 35 minutes  Critical care time was exclusive of separately billable procedures and treating other patients.  Critical care was necessary to treat or prevent imminent or life-threatening deterioration.  Critical care was time spent personally by me on the following activities: development of treatment plan with patient and/or surrogate as well as nursing, discussions with consultants, evaluation of patient's response to treatment, examination of patient, obtaining history from patient or surrogate, ordering and performing treatments and interventions, ordering and review of laboratory studies, ordering and review of radiographic studies, pulse oximetry and re-evaluation of patient's condition.   I personally performed the services described in this documentation, which was scribed in my presence. The recorded information has been reviewed and is accurate.    Orpah Greek, MD 04/20/16 [phone]

## 2016-04-20 ENCOUNTER — Emergency Department (HOSPITAL_COMMUNITY): Payer: Medicare Other

## 2016-04-20 DIAGNOSIS — R739 Hyperglycemia, unspecified: Secondary | ICD-10-CM | POA: Diagnosis present

## 2016-04-20 DIAGNOSIS — N179 Acute kidney failure, unspecified: Secondary | ICD-10-CM | POA: Diagnosis not present

## 2016-04-20 DIAGNOSIS — Z79899 Other long term (current) drug therapy: Secondary | ICD-10-CM | POA: Diagnosis not present

## 2016-04-20 DIAGNOSIS — Z853 Personal history of malignant neoplasm of breast: Secondary | ICD-10-CM | POA: Diagnosis not present

## 2016-04-20 DIAGNOSIS — Z885 Allergy status to narcotic agent status: Secondary | ICD-10-CM | POA: Diagnosis not present

## 2016-04-20 DIAGNOSIS — J9691 Respiratory failure, unspecified with hypoxia: Secondary | ICD-10-CM | POA: Diagnosis present

## 2016-04-20 DIAGNOSIS — A4151 Sepsis due to Escherichia coli [E. coli]: Secondary | ICD-10-CM | POA: Diagnosis present

## 2016-04-20 DIAGNOSIS — G4700 Insomnia, unspecified: Secondary | ICD-10-CM | POA: Diagnosis present

## 2016-04-20 DIAGNOSIS — J8410 Pulmonary fibrosis, unspecified: Secondary | ICD-10-CM | POA: Diagnosis present

## 2016-04-20 DIAGNOSIS — K219 Gastro-esophageal reflux disease without esophagitis: Secondary | ICD-10-CM | POA: Diagnosis present

## 2016-04-20 DIAGNOSIS — E876 Hypokalemia: Secondary | ICD-10-CM | POA: Diagnosis present

## 2016-04-20 DIAGNOSIS — B348 Other viral infections of unspecified site: Secondary | ICD-10-CM | POA: Diagnosis present

## 2016-04-20 DIAGNOSIS — I10 Essential (primary) hypertension: Secondary | ICD-10-CM | POA: Diagnosis present

## 2016-04-20 DIAGNOSIS — Z7982 Long term (current) use of aspirin: Secondary | ICD-10-CM | POA: Diagnosis not present

## 2016-04-20 DIAGNOSIS — A419 Sepsis, unspecified organism: Secondary | ICD-10-CM | POA: Diagnosis present

## 2016-04-20 DIAGNOSIS — Z880 Allergy status to penicillin: Secondary | ICD-10-CM | POA: Diagnosis not present

## 2016-04-20 DIAGNOSIS — R6521 Severe sepsis with septic shock: Secondary | ICD-10-CM | POA: Diagnosis not present

## 2016-04-20 DIAGNOSIS — K5900 Constipation, unspecified: Secondary | ICD-10-CM | POA: Diagnosis present

## 2016-04-20 DIAGNOSIS — J189 Pneumonia, unspecified organism: Secondary | ICD-10-CM | POA: Diagnosis not present

## 2016-04-20 DIAGNOSIS — F419 Anxiety disorder, unspecified: Secondary | ICD-10-CM | POA: Diagnosis present

## 2016-04-20 DIAGNOSIS — E785 Hyperlipidemia, unspecified: Secondary | ICD-10-CM | POA: Diagnosis present

## 2016-04-20 DIAGNOSIS — N390 Urinary tract infection, site not specified: Secondary | ICD-10-CM | POA: Diagnosis present

## 2016-04-20 DIAGNOSIS — Z888 Allergy status to other drugs, medicaments and biological substances status: Secondary | ICD-10-CM | POA: Diagnosis not present

## 2016-04-20 DIAGNOSIS — G629 Polyneuropathy, unspecified: Secondary | ICD-10-CM | POA: Diagnosis present

## 2016-04-20 DIAGNOSIS — J811 Chronic pulmonary edema: Secondary | ICD-10-CM | POA: Diagnosis present

## 2016-04-20 DIAGNOSIS — Z87891 Personal history of nicotine dependence: Secondary | ICD-10-CM | POA: Diagnosis not present

## 2016-04-20 LAB — BLOOD CULTURE ID PANEL (REFLEXED)
ACINETOBACTER BAUMANNII: NOT DETECTED
CANDIDA ALBICANS: NOT DETECTED
CANDIDA GLABRATA: NOT DETECTED
CANDIDA PARAPSILOSIS: NOT DETECTED
CANDIDA TROPICALIS: NOT DETECTED
Candida krusei: NOT DETECTED
Carbapenem resistance: NOT DETECTED
ENTEROBACTER CLOACAE COMPLEX: NOT DETECTED
ENTEROBACTERIACEAE SPECIES: DETECTED — AB
Enterococcus species: NOT DETECTED
Escherichia coli: DETECTED — AB
HAEMOPHILUS INFLUENZAE: NOT DETECTED
KLEBSIELLA PNEUMONIAE: NOT DETECTED
Klebsiella oxytoca: NOT DETECTED
LISTERIA MONOCYTOGENES: NOT DETECTED
METHICILLIN RESISTANCE: NOT DETECTED
Neisseria meningitidis: NOT DETECTED
PROTEUS SPECIES: NOT DETECTED
Pseudomonas aeruginosa: NOT DETECTED
SERRATIA MARCESCENS: NOT DETECTED
STAPHYLOCOCCUS AUREUS BCID: NOT DETECTED
STREPTOCOCCUS PNEUMONIAE: NOT DETECTED
Staphylococcus species: NOT DETECTED
Streptococcus agalactiae: NOT DETECTED
Streptococcus pyogenes: NOT DETECTED
Streptococcus species: NOT DETECTED
VANCOMYCIN RESISTANCE: NOT DETECTED

## 2016-04-20 LAB — CBC
HCT: 27.8 % — ABNORMAL LOW (ref 36.0–46.0)
HEMOGLOBIN: 8.9 g/dL — AB (ref 12.0–15.0)
MCH: 31.6 pg (ref 26.0–34.0)
MCHC: 32 g/dL (ref 30.0–36.0)
MCV: 98.6 fL (ref 78.0–100.0)
Platelets: 166 10*3/uL (ref 150–400)
RBC: 2.82 MIL/uL — ABNORMAL LOW (ref 3.87–5.11)
RDW: 14.1 % (ref 11.5–15.5)
WBC: 22.3 10*3/uL — ABNORMAL HIGH (ref 4.0–10.5)

## 2016-04-20 LAB — MRSA PCR SCREENING: MRSA by PCR: NEGATIVE

## 2016-04-20 LAB — RESPIRATORY PANEL BY PCR
Adenovirus: NOT DETECTED
Bordetella pertussis: NOT DETECTED
CORONAVIRUS HKU1-RVPPCR: NOT DETECTED
CORONAVIRUS NL63-RVPPCR: NOT DETECTED
CORONAVIRUS OC43-RVPPCR: NOT DETECTED
Chlamydophila pneumoniae: NOT DETECTED
Coronavirus 229E: NOT DETECTED
INFLUENZA A H3-RVPPCR: NOT DETECTED
INFLUENZA B-RVPPCR: NOT DETECTED
Influenza A H1 2009: NOT DETECTED
Influenza A H1: NOT DETECTED
Influenza A: NOT DETECTED
METAPNEUMOVIRUS-RVPPCR: NOT DETECTED
Mycoplasma pneumoniae: NOT DETECTED
PARAINFLUENZA VIRUS 2-RVPPCR: NOT DETECTED
PARAINFLUENZA VIRUS 3-RVPPCR: NOT DETECTED
Parainfluenza Virus 1: NOT DETECTED
Parainfluenza Virus 4: NOT DETECTED
RHINOVIRUS / ENTEROVIRUS - RVPPCR: DETECTED — AB
Respiratory Syncytial Virus: NOT DETECTED

## 2016-04-20 LAB — URINALYSIS, ROUTINE W REFLEX MICROSCOPIC
BILIRUBIN URINE: NEGATIVE
Glucose, UA: NEGATIVE mg/dL
KETONES UR: NEGATIVE mg/dL
NITRITE: NEGATIVE
Protein, ur: 100 mg/dL — AB
SPECIFIC GRAVITY, URINE: 1.01 (ref 1.005–1.030)
pH: 5.5 (ref 5.0–8.0)

## 2016-04-20 LAB — CBC WITH DIFFERENTIAL/PLATELET
BASOS ABS: 0 10*3/uL (ref 0.0–0.1)
BASOS PCT: 0 %
EOS ABS: 0 10*3/uL (ref 0.0–0.7)
Eosinophils Relative: 0 %
HCT: 35.2 % — ABNORMAL LOW (ref 36.0–46.0)
Hemoglobin: 11.5 g/dL — ABNORMAL LOW (ref 12.0–15.0)
LYMPHS PCT: 6 %
Lymphs Abs: 0.9 10*3/uL (ref 0.7–4.0)
MCH: 31.6 pg (ref 26.0–34.0)
MCHC: 32.7 g/dL (ref 30.0–36.0)
MCV: 96.7 fL (ref 78.0–100.0)
MONO ABS: 0.4 10*3/uL (ref 0.1–1.0)
Monocytes Relative: 3 %
NEUTROS PCT: 91 %
Neutro Abs: 13.2 10*3/uL — ABNORMAL HIGH (ref 1.7–7.7)
PLATELETS: 177 10*3/uL (ref 150–400)
RBC: 3.64 MIL/uL — ABNORMAL LOW (ref 3.87–5.11)
RDW: 13.8 % (ref 11.5–15.5)
WBC: 14.5 10*3/uL — AB (ref 4.0–10.5)

## 2016-04-20 LAB — URINE MICROSCOPIC-ADD ON

## 2016-04-20 LAB — COMPREHENSIVE METABOLIC PANEL
ALK PHOS: 43 U/L (ref 38–126)
ALT: 31 U/L (ref 14–54)
ANION GAP: 14 (ref 5–15)
AST: 27 U/L (ref 15–41)
Albumin: 3 g/dL — ABNORMAL LOW (ref 3.5–5.0)
BILIRUBIN TOTAL: 0.9 mg/dL (ref 0.3–1.2)
BUN: 31 mg/dL — ABNORMAL HIGH (ref 6–20)
CALCIUM: 8.3 mg/dL — AB (ref 8.9–10.3)
CO2: 20 mmol/L — AB (ref 22–32)
CREATININE: 1.73 mg/dL — AB (ref 0.44–1.00)
Chloride: 101 mmol/L (ref 101–111)
GFR, EST AFRICAN AMERICAN: 29 mL/min — AB (ref >60)
GFR, EST NON AFRICAN AMERICAN: 25 mL/min — AB (ref >60)
Glucose, Bld: 158 mg/dL — ABNORMAL HIGH (ref 65–99)
Potassium: 3.1 mmol/L — ABNORMAL LOW (ref 3.5–5.1)
Sodium: 135 mmol/L (ref 135–145)
TOTAL PROTEIN: 6.4 g/dL — AB (ref 6.5–8.1)

## 2016-04-20 LAB — TROPONIN I
TROPONIN I: 0.11 ng/mL — AB (ref <0.031)
Troponin I: 0.08 ng/mL — ABNORMAL HIGH (ref <0.031)
Troponin I: 0.13 ng/mL — ABNORMAL HIGH (ref <0.031)

## 2016-04-20 LAB — LACTIC ACID, PLASMA: Lactic Acid, Venous: 1 mmol/L (ref 0.5–2.0)

## 2016-04-20 LAB — PROCALCITONIN: Procalcitonin: >175 ng/mL

## 2016-04-20 LAB — CREATININE, SERUM
CREATININE: 1.32 mg/dL — AB (ref 0.44–1.00)
GFR calc Af Amer: 40 mL/min — ABNORMAL LOW (ref >60)
GFR, EST NON AFRICAN AMERICAN: 35 mL/min — AB (ref >60)

## 2016-04-20 LAB — I-STAT CG4 LACTIC ACID, ED
Lactic Acid, Venous: 1.15 mmol/L (ref 0.5–2.0)
Lactic Acid, Venous: 3.03 mmol/L (ref 0.5–2.0)

## 2016-04-20 LAB — GLUCOSE, CAPILLARY: Glucose-Capillary: 110 mg/dL — ABNORMAL HIGH (ref 65–99)

## 2016-04-20 MED ORDER — PHENYLEPHRINE HCL 10 MG/ML IJ SOLN
30.0000 ug/min | INTRAVENOUS | Status: DC
  Administered 2016-04-20 (×2): 30 ug/min via INTRAVENOUS
  Filled 2016-04-20 (×3): qty 1

## 2016-04-20 MED ORDER — VANCOMYCIN HCL IN DEXTROSE 1-5 GM/200ML-% IV SOLN
1000.0000 mg | INTRAVENOUS | Status: DC
  Administered 2016-04-21: 1000 mg via INTRAVENOUS
  Filled 2016-04-20: qty 200

## 2016-04-20 MED ORDER — DEXTROSE 5 % IV SOLN
2.0000 g | Freq: Three times a day (TID) | INTRAVENOUS | Status: DC
  Administered 2016-04-20 – 2016-04-21 (×3): 2 g via INTRAVENOUS
  Filled 2016-04-20 (×4): qty 2

## 2016-04-20 MED ORDER — CALCIUM CARBONATE-VITAMIN D 500-200 MG-UNIT PO TABS: 1.0000 | ORAL_TABLET | ORAL | Status: DC

## 2016-04-20 MED ORDER — ALPRAZOLAM 0.25 MG PO TABS
0.2500 mg | ORAL_TABLET | Freq: Once | ORAL | Status: AC
  Administered 2016-04-20: 0.25 mg via ORAL
  Filled 2016-04-20: qty 1

## 2016-04-20 MED ORDER — HEPARIN SODIUM (PORCINE) 5000 UNIT/ML IJ SOLN
5000.0000 [IU] | Freq: Three times a day (TID) | INTRAMUSCULAR | Status: DC
  Administered 2016-04-20 – 2016-04-23 (×9): 5000 [IU] via SUBCUTANEOUS
  Filled 2016-04-20 (×10): qty 1

## 2016-04-20 MED ORDER — GUAIFENESIN-DM 100-10 MG/5ML PO SYRP
10.0000 mL | ORAL_SOLUTION | ORAL | Status: DC | PRN
  Administered 2016-04-20 – 2016-04-21 (×5): 10 mL via ORAL
  Filled 2016-04-20 (×7): qty 10

## 2016-04-20 MED ORDER — SODIUM CHLORIDE 0.9 % IV BOLUS (SEPSIS)
500.0000 mL | Freq: Once | INTRAVENOUS | Status: AC
  Administered 2016-04-20: 500 mL via INTRAVENOUS

## 2016-04-20 MED ORDER — CARBAMAZEPINE ER 200 MG PO TB12
200.0000 mg | ORAL_TABLET | Freq: Two times a day (BID) | ORAL | Status: DC
  Administered 2016-04-20 – 2016-04-23 (×7): 200 mg via ORAL
  Filled 2016-04-20 (×7): qty 1

## 2016-04-20 MED ORDER — SODIUM CHLORIDE 0.9 % IV BOLUS (SEPSIS)
1000.0000 mL | Freq: Once | INTRAVENOUS | Status: AC
  Administered 2016-04-20: 1000 mL via INTRAVENOUS

## 2016-04-20 MED ORDER — SODIUM CHLORIDE 0.9 % IV SOLN: 250.0000 mL | INTRAVENOUS | Status: DC | PRN

## 2016-04-20 MED ORDER — SODIUM CHLORIDE 0.9 % IV SOLN
INTRAVENOUS | Status: DC
  Administered 2016-04-20 (×2): via INTRAVENOUS

## 2016-04-20 MED ORDER — SODIUM CHLORIDE 0.9 % IV BOLUS (SEPSIS)
500.0000 mL | INTRAVENOUS | Status: AC
Start: 2016-04-20 — End: 2016-04-20
  Administered 2016-04-20: 500 mL via INTRAVENOUS

## 2016-04-20 MED ORDER — ONDANSETRON HCL 4 MG/2ML IJ SOLN: 4.0000 mg | Freq: Four times a day (QID) | INTRAMUSCULAR | Status: DC | PRN

## 2016-04-20 MED ORDER — POTASSIUM CHLORIDE CRYS ER 20 MEQ PO TBCR
40.0000 meq | EXTENDED_RELEASE_TABLET | Freq: Once | ORAL | Status: AC
  Administered 2016-04-20: 40 meq via ORAL
  Filled 2016-04-20: qty 2

## 2016-04-20 MED ORDER — ASPIRIN EC 81 MG PO TBEC
81.0000 mg | DELAYED_RELEASE_TABLET | Freq: Every day | ORAL | Status: DC
  Administered 2016-04-21 – 2016-04-23 (×3): 81 mg via ORAL
  Filled 2016-04-20 (×3): qty 1

## 2016-04-20 MED ORDER — LEVOFLOXACIN IN D5W 750 MG/150ML IV SOLN
750.0000 mg | INTRAVENOUS | Status: DC
  Administered 2016-04-22: 750 mg via INTRAVENOUS
  Filled 2016-04-20 (×3): qty 150

## 2016-04-20 MED ORDER — VANCOMYCIN HCL IN DEXTROSE 750-5 MG/150ML-% IV SOLN
750.0000 mg | INTRAVENOUS | Status: DC
  Filled 2016-04-20: qty 150

## 2016-04-20 NOTE — Progress Notes (Signed)
PHARMACY - PHYSICIAN COMMUNICATION CRITICAL VALUE ALERT - BLOOD CULTURE IDENTIFICATION (BCID)  Results for orders placed or performed during the hospital encounter of 04/19/16  Blood Culture (routine x 2)     Status: None (Preliminary result)   Collection Time: 04/19/16 11:41 PM  Result Value Ref Range Status   Specimen Description BLOOD LEFT ANTECUBITAL  Final   Special Requests BOTTLES DRAWN AEROBIC AND ANAEROBIC 5CC EA  Final   Culture  Setup Time   Final    GRAM NEGATIVE RODS ANAEROBIC BOTTLE ONLY Organism ID to follow    Culture GRAM NEGATIVE RODS  Final   Report Status PENDING  Incomplete  Blood Culture ID Panel (Reflexed)     Status: Abnormal   Collection Time: 04/19/16 11:41 PM  Result Value Ref Range Status   Enterococcus species NOT DETECTED NOT DETECTED Final   Vancomycin resistance NOT DETECTED NOT DETECTED Final   Listeria monocytogenes NOT DETECTED NOT DETECTED Final   Staphylococcus species NOT DETECTED NOT DETECTED Final   Staphylococcus aureus NOT DETECTED NOT DETECTED Final   Methicillin resistance NOT DETECTED NOT DETECTED Final   Streptococcus species NOT DETECTED NOT DETECTED Final   Streptococcus agalactiae NOT DETECTED NOT DETECTED Final   Streptococcus pneumoniae NOT DETECTED NOT DETECTED Final   Streptococcus pyogenes NOT DETECTED NOT DETECTED Final   Acinetobacter baumannii NOT DETECTED NOT DETECTED Final   Enterobacteriaceae species DETECTED (A) NOT DETECTED Final    Comment: CRITICAL RESULT CALLED TO, READ BACK BY AND VERIFIED WITH: N. Kylia Grajales Pharm.D. 17:35 04/20/16 ([email or web address])    Enterobacter cloacae complex NOT DETECTED NOT DETECTED Final   Escherichia coli DETECTED (A) NOT DETECTED Final    Comment: CRITICAL RESULT CALLED TO, READ BACK BY AND VERIFIED WITH: N. Mackinley Kiehn Pharm.D. 17:35 04/20/16 ([email or web address])    Klebsiella oxytoca NOT DETECTED NOT DETECTED Final   Klebsiella pneumoniae NOT DETECTED NOT DETECTED Final   Proteus species NOT DETECTED  NOT DETECTED Final   Serratia marcescens NOT DETECTED NOT DETECTED Final   Carbapenem resistance NOT DETECTED NOT DETECTED Final   Haemophilus influenzae NOT DETECTED NOT DETECTED Final   Neisseria meningitidis NOT DETECTED NOT DETECTED Final   Pseudomonas aeruginosa NOT DETECTED NOT DETECTED Final   Candida albicans NOT DETECTED NOT DETECTED Final   Candida glabrata NOT DETECTED NOT DETECTED Final   Candida krusei NOT DETECTED NOT DETECTED Final   Candida parapsilosis NOT DETECTED NOT DETECTED Final   Candida tropicalis NOT DETECTED NOT DETECTED Final  MRSA PCR Screening     Status: None   Collection Time: 04/20/16  6:53 AM  Result Value Ref Range Status   MRSA by PCR NEGATIVE NEGATIVE Final    Comment:        The GeneXpert MRSA Assay (FDA approved for NASAL specimens only), is one component of a comprehensive MRSA colonization surveillance program. It is not intended to diagnose MRSA infection nor to guide or monitor treatment for MRSA infections.   Respiratory Panel by PCR     Status: Abnormal   Collection Time: 04/20/16 12:14 PM  Result Value Ref Range Status   Adenovirus NOT DETECTED NOT DETECTED Final   Coronavirus 229E NOT DETECTED NOT DETECTED Final   Coronavirus HKU1 NOT DETECTED NOT DETECTED Final   Coronavirus NL63 NOT DETECTED NOT DETECTED Final   Coronavirus OC43 NOT DETECTED NOT DETECTED Final   Metapneumovirus NOT DETECTED NOT DETECTED Final   Rhinovirus / Enterovirus DETECTED (A) NOT DETECTED Final   Influenza A NOT DETECTED NOT DETECTED Final  Influenza A H1 NOT DETECTED NOT DETECTED Final   Influenza A H1 2009 NOT DETECTED NOT DETECTED Final   Influenza A H3 NOT DETECTED NOT DETECTED Final   Influenza B NOT DETECTED NOT DETECTED Final   Parainfluenza Virus 1 NOT DETECTED NOT DETECTED Final   Parainfluenza Virus 2 NOT DETECTED NOT DETECTED Final   Parainfluenza Virus 3 NOT DETECTED NOT DETECTED Final   Parainfluenza Virus 4 NOT DETECTED NOT DETECTED Final    Respiratory Syncytial Virus NOT DETECTED NOT DETECTED Final   Bordetella pertussis NOT DETECTED NOT DETECTED Final   Chlamydophila pneumoniae NOT DETECTED NOT DETECTED Final   Mycoplasma pneumoniae NOT DETECTED NOT DETECTED Final    Name of physician (or Provider) Contacted: Paged Dr. Oletta Darter with critical result.  Changes to prescribed antibiotics required:  After discussion with Dr. Oletta Darter, will add aztreonam 2g IV Q8 to abx regimen to better cover E. Coli bacteremia. Levaquin could cover E. coli bacteremia but has a higher resistance rate so need to add extra coverage. Patient has PCN allergy (hives) and no history of cephalosporin use. Will continue Levaquin and vancomycin as well for now due to risk of severe CAP to cover for atypicals.  MRSA PCR negative, follow up with day shift team tomorrow to consider stopping vancomycin and considering need to continue Levaquin.  Elenor Quinones, PharmD, BCPS Clinical Pharmacist Pager [phone] 04/20/2016 5:56 PM

## 2016-04-20 NOTE — Progress Notes (Signed)
Pharmacy Antibiotic Note  Amanda Long is a 82 y.o. female admitted on 04/19/2016 with PNA.  Pharmacy has been consulted for Vancomycin and Levaquin dosing. Tm 101.5 for EMS.  Patient's renal function has improved since this AM  Plan: - Change vanc to 1gm IV Q24H - Continue LVQ 750mg  IV Q48H - Monitor renal fxn, clinical progress, vanc trough as indicated  Height: 5\' 6"  (167.6 cm) Weight: 161 lb (73.029 kg) IBW/kg (Calculated) : 59.3  Temp (24hrs), Avg:98.6 F (37 C), Min:98.5 F (36.9 C), Max:98.6 F (37 C)   Recent Labs Lab 04/19/16 2341 04/19/16 2352 04/20/16 0249 04/20/16 0614 04/20/16 0904  WBC 14.5*  --   --  22.3*  --   CREATININE 1.73*  --   --  1.32*  --   LATICACIDVEN  --  3.03* 1.15  --  1.0    Estimated Creatinine Clearance: 30.1 mL/min (by C-G formula based on Cr of 1.32).    Allergies  Allergen Reactions  . Codeine     REACTION: itching/vomiting  . Dilaudid [Hydromorphone Hcl]   . Meperidine Hcl     REACTION: resp. distress  . Penicillins     REACTION: hives    Antimicrobials this admission: 5/8 Levaquin >>  5/8 Vanc >>   Dose adjustments this admission: n/a  Microbiology results: 5/7 BCx x2 - 5/8 UCx -  5/8 MRSA PCR - negative   Amanda Long, PharmD, BCPS Pager:  [phone] 04/20/2016, 11:18 AM

## 2016-04-20 NOTE — ED Notes (Signed)
Consent for central line placement obtained and at bedside

## 2016-04-20 NOTE — ED Notes (Signed)
E-Link called regarding patient being placed on vasopressors, since SBP continues to be < 90. MD has been aware and is working with critical care at this time. Central line has just been placed - waiting for x-ray to verify placement.

## 2016-04-20 NOTE — Progress Notes (Signed)
eLink Physician-Brief Progress Note Patient Name: Amanda Long DOB: 14-Jun-1925 MRN: [id number]   Date of Service  04/20/2016  HPI/Events of Note  Anxiety.  eICU Interventions  Will order: 1. Xanax 0.25 mg PO X 1 now.      Intervention Category Minor Interventions: Agitation / anxiety - evaluation and management  Sommer,Steven Eugene 04/20/2016, 7:59 PM

## 2016-04-20 NOTE — Progress Notes (Signed)
Pharmacy Antibiotic Note  Amanda Long is a 82 y.o. female admitted on 04/19/2016 with PNA.  Pharmacy has been consulted for Vancomycin and Levaquin dosing. Tm 101.5 for EMS.   Vanc 1gm and Levaquin 750mg  IV given in ED ~0100  SCr up to 1.73 (baseline !1). Estimated CrCl 23 ml/min  Plan: Levaquin 750mg  IV q48h Vancomycin 750mg  IV q24h Will f/u micro data, renal function, and pt's clinical condition Vanc trough prn  Height: 5\' 6"  (167.6 cm) Weight: 161 lb (73.029 kg) IBW/kg (Calculated) : 59.3  Temp (24hrs), Avg:98.6 F (37 C), Min:98.6 F (37 C), Max:98.6 F (37 C)   Recent Labs Lab 04/19/16 2341  WBC 14.5*    CrCl cannot be calculated (Patient has no serum creatinine result on file.).    Allergies  Allergen Reactions  . Codeine     REACTION: itching/vomiting  . Dilaudid [Hydromorphone Hcl]   . Meperidine Hcl     REACTION: resp. distress  . Penicillins     REACTION: hives    Antimicrobials this admission: 5/8 Levaquin >>  5/8 Vanc >>   Dose adjustments this admission: n/a  Microbiology results: 5/7 BCx x2:   UCx:    Thank you for allowing pharmacy to be a part of this patient's care.  Sherlon Handing, PharmD, BCPS Clinical pharmacist, pager [phone] 04/20/2016 12:08 AM

## 2016-04-20 NOTE — ED Notes (Signed)
Dr. Betsey Holiday and Fredderick Phenix PA at bedside.

## 2016-04-20 NOTE — ED Notes (Signed)
Dr. Alcario Drought at bedside with this RN

## 2016-04-20 NOTE — ED Provider Notes (Signed)
CENTRAL LINE Performed by: Antonietta Breach Consent: The procedure was performed in an emergent situation. Required items: required blood products, implants, devices, and special equipment available Patient identity confirmed: arm band and provided demographic data Time out: Immediately prior to procedure a "time out" was called to verify the correct patient, procedure, equipment, support staff and site/side marked as required. Indications: vascular access Anesthesia: local infiltration Local anesthetic: lidocaine 1% with epinephrine Anesthetic total: 3 ml Patient sedated: no Preparation: skin prepped with 2% chlorhexidine Skin prep agent dried: skin prep agent completely dried prior to procedure Sterile barriers: all five maximum sterile barriers used - cap, mask, sterile gown, sterile gloves, and large sterile sheet Hand hygiene: hand hygiene performed prior to central venous catheter insertion  Location details: right IVJ  Catheter type: triple lumen Catheter size: 8 Fr Pre-procedure: landmarks identified Ultrasound guidance: yes Successful placement: yes Post-procedure: line sutured and dressing applied Assessment: blood return through all parts, free fluid flow, placement verified by x-ray and no pneumothorax on x-ray Patient tolerance: Patient tolerated the procedure well with no immediate complications.   Antonietta Breach, PA-C 04/20/16 0448  Orpah Greek, MD 04/20/16 [phone]

## 2016-04-20 NOTE — Progress Notes (Signed)
eLink Physician-Brief Progress Note Patient Name: Amanda Long DOB: [birth year] MRN: [id number]   Date of Service  04/20/2016  HPI/Events of Note  ED calls re: possible ICU admission.  50F, with fibrosis, comes in with SOB, fever, cough. Seen last week at office for the same. BP 80-sys > received 3L . BP still in the 80s. BP runs in the 150. CXR with LLL PNA. Awake, comfortable.   eICU Interventions  IJ placed by ED Lactate is better. Start neosynephrine. May need low dose.  PCCM to evaluate pt.      Intervention Category Major Interventions: Other:  Rush Landmark 04/20/2016, 5:38 AM

## 2016-04-20 NOTE — Care Management Note (Signed)
Case Management Note  Patient Details  Name: Amanda Long MRN: [id number] Date of Birth: [birth year]  Subjective/Objective:   Pt admitted with septic shock                 Action/Plan:  Pt is from home with husband.  CM ordered PT/OT consult.  CM will continue to monitor for disposition needs   Expected Discharge Date:                  Expected Discharge Plan:  [facility]  In-House Referral:     Discharge planning Services  CM Consult  Post Acute Care Choice:    Choice offered to:     DME Arranged:    DME Agency:     HH Arranged:    [place] Agency:     Status of Service:  In process, will continue to follow  Medicare Important Message Given:    Date Medicare IM Given:    Medicare IM give by:    Date Additional Medicare IM Given:    Additional Medicare Important Message give by:     If discussed at [place] of Stay Meetings, dates discussed:    Additional Comments:  Maryclare Labrador, RN 04/20/2016, 2:26 PM

## 2016-04-20 NOTE — Progress Notes (Signed)
eLink Physician-Brief Progress Note Patient Name: OMERA ADDEO DOB: [birth year] MRN: [id number]   Date of Service  04/20/2016  HPI/Events of Note  Cough.  eICU Interventions  Will order: 1. Robitussin DM 10 mL PO Q 4 hours PRN cough.     Intervention Category Intermediate Interventions: Other:  Lysle Dingwall 04/20/2016, 6:36 PM

## 2016-04-20 NOTE — Progress Notes (Signed)
Pt arrived to unit via bed with ED RNs.  Pt oriented to unit staff and plan of care.  Pt with no complaints at this time.  Vitals and assessment stable.  Will continue to monitor

## 2016-04-20 NOTE — H&P (Signed)
PULMONARY / CRITICAL CARE MEDICINE   Name: Amanda Long MRN: [id number] DOB: May 04, 1925    ADMISSION DATE:  04/19/2016  REFERRING MD:  EDP  CHIEF COMPLAINT:  Sepsis   HISTORY OF PRESENT ILLNESS:   82yo female with hx pulmonary fibrosis, HTN presented 5/7 with 2 day hx sob, worsening cough, fevers/chills.  In ER was hypotensive despite >3L fluid resuscitation and Neo gtt started. PCCM called for admit.    Denies chest pain, hemoptysis, edema, orthopnea, dizziness/syncope, n/v/d.    PAST MEDICAL HISTORY :  She  has a past medical history of GERD (gastroesophageal reflux disease); Hyperlipidemia; Hypertension; Peripheral neuropathy ([place]); Breast cancer ([place]); Duodenitis without mention of hemorrhage; Unspecified gastritis and gastroduodenitis without mention of hemorrhage; History of colon polyps; Diverticulosis of colon (without mention of hemorrhage); Internal hemorrhoids without mention of complication; and Blood transfusion.  PAST SURGICAL HISTORY: She  has past surgical history that includes Total abdominal hysterectomy; Tonsillectomy; Mastectomy; and Appendectomy.  Allergies  Allergen Reactions  . Codeine     REACTION: itching/vomiting  . Dilaudid [Hydromorphone Hcl]   . Meperidine Hcl     REACTION: resp. distress  . Penicillins     REACTION: hives    No current facility-administered medications on file prior to encounter.   Current Outpatient Prescriptions on File Prior to Encounter  Medication Sig  . amLODipine (NORVASC) 5 MG tablet TAKE ONE TABLET BY MOUTH ONCE DAILY  . aspirin 81 MG EC tablet Take 81 mg by mouth daily.    . Calcium Carbonate-Vitamin D (CALCIUM 600+D) 600-400 MG-UNIT per tablet Take 1 tablet by mouth 2 (two) times a week.   . furosemide (LASIX) 20 MG tablet Take 1 tablet (20 mg total) by mouth daily.  [name] gabapentin (NEURONTIN) 100 MG capsule 2 tabs twice daily (Patient taking differently: Take 200 mg by mouth 2 (two) times daily. )  . ibuprofen  (ADVIL,MOTRIN) 200 MG tablet Take 200 mg by mouth every 6 (six) hours as needed for fever or mild pain.   . metoprolol succinate (TOPROL-XL) 100 MG 24 hr tablet One half tablet daily (Patient taking differently: Take 50 mg by mouth daily. )  . omeprazole (PRILOSEC) 20 MG capsule Take 1 capsule (20 mg total) by mouth daily as needed.  . pravastatin (PRAVACHOL) 20 MG tablet Take 1 tablet (20 mg total) by mouth at bedtime.  . predniSONE (DELTASONE) 10 MG tablet 4 tabs for 2 days, then 3 tabs for 2 days, 2 tabs for 2 days, then 1 tab for 2 days, then stop  . TEGRETOL-XR 100 MG 12 hr tablet Take 2 tablets (200 mg total) by mouth 2 (two) times daily. **NEEDS O/V FOR FURTHER REFILLS**  . vitamin E 400 UNIT capsule Take 400 Units by mouth 2 (two) times daily.   . ciprofloxacin (CIPRO) 250 MG tablet Take 1 tablet (250 mg total) by mouth 2 (two) times daily. (Patient not taking: Reported on 04/20/2016)    FAMILY HISTORY:  Her indicated that her mother is alive.   SOCIAL HISTORY: She  reports that she quit smoking about 32 years ago. Her smoking use included Cigarettes. She has a 10 pack-year smoking history. She has never used smokeless tobacco. She reports that she does not drink alcohol or use illicit drugs.  REVIEW OF SYSTEMS:   As per HPI - All other systems reviewed and were neg.    SUBJECTIVE:    VITAL SIGNS: BP 86/50 mmHg  Pulse 81  Temp(Src) 98.6 F (37 C) (Oral)  Resp 23  Ht 5\' 6"  (1.676 m)  Wt 73.029 kg (161 lb)  BMI 26.00 kg/m2  SpO2 97%  HEMODYNAMICS:    VENTILATOR SETTINGS:    INTAKE / OUTPUT:    PHYSICAL EXAMINATION: General:  Pleasant elderly female, NAD in ER  Neuro:  Awake, alert, appropriate, MAE, HOH HEENT:  Mm dry, no JVD  Cardiovascular:  s1s2 rrr Lungs:  resps even non labored on [place], scattered dry crackles, diminished L base  Abdomen:  Round, soft, non tender  Musculoskeletal:  Warm and dry, no edema   LABS:  BMET  Recent Labs Lab 04/19/16 2341  NA  135  K 3.1*  CL 101  CO2 20*  BUN 31*  CREATININE 1.73*  GLUCOSE 158*    Electrolytes  Recent Labs Lab 04/19/16 2341  CALCIUM 8.3*    CBC  Recent Labs Lab 04/19/16 2341  WBC 14.5*  HGB 11.5*  HCT 35.2*  PLT 177    Coag's No results for input(s): APTT, INR in the last 168 hours.  Sepsis Markers  Recent Labs Lab 04/19/16 2352 04/20/16 0249  LATICACIDVEN 3.03* 1.15    ABG No results for input(s): PHART, PCO2ART, PO2ART in the last 168 hours.  Liver Enzymes  Recent Labs Lab 04/19/16 2341  AST 27  ALT 31  ALKPHOS 43  BILITOT 0.9  ALBUMIN 3.0*    Cardiac Enzymes  Recent Labs Lab 04/20/16 0441  TROPONINI 0.11*    Glucose No results for input(s): GLUCAP in the last 168 hours.  Imaging Dg Chest 2 View  04/20/2016  CLINICAL DATA:  Sepsis EXAM: CHEST  2 VIEW COMPARISON:  04/13/2016 FINDINGS: There is unchanged moderate cardiomegaly. There is consolidation in the left base which may represent pneumonia. No large effusion. Severe emphysematous and fibrotic disease is present throughout both lungs. IMPRESSION: Probable left lower lobe pneumonia superimposed on severe COPD. Followup PA and lateral chest X-ray is recommended in 3-4 weeks following trial of antibiotic therapy to ensure resolution and exclude underlying malignancy. Electronically Signed   By: Andreas Newport M.D.   On: 04/20/2016 00:26   Dg Chest Portable 1 View  04/20/2016  CLINICAL DATA:  Central line placement EXAM: PORTABLE CHEST 1 VIEW COMPARISON:  04/19/2016 FINDINGS: There is a new right jugular central line with tip at the expected location of the cavoatrial junction. There is no pneumothorax. Moderate vascular fullness is present. Left base airspace opacity is again evident. No large effusion is evident. IMPRESSION: Right jugular central line appears satisfactorily positioned. No pneumothorax. No other significant interval change. Electronically Signed   By: Andreas Newport M.D.   On:  04/20/2016 05:04     STUDIES:    CULTURES: BCx2 5/8>>> Urine 5/8>>>  ANTIBIOTICS: Vancomycin 5/8>>> levaquin 5/8>>>  SIGNIFICANT EVENTS:   LINES/TUBES: R IJ CVL (EDP) 5/8>>>  DISCUSSION: 82yo female with hx pulmonary fibrosis admitted 5/8 with sepsis r/t probable CAP.   ASSESSMENT / PLAN:  PULMONARY Pulmonary fibrosis  CAP  P:   Supplemental O2 as needed  PRN BD  Pulmonary hygiene  Sputum culture if able  F/u CXR   CARDIOVASCULAR Septic shock  Hx HTN P:  Continue low dose neo gtt and titrate as able to keep MAP >65 Hold home norvac, lopressor, lasix  CVP 3 -- will give additional 577ml bolus  Monitor CVP  F/u lactate   RENAL AKI -- baseline Scr ~0.9-1.0 Hypokalemia  P:   Volume as above  F/u chem  Replete K PRN   GASTROINTESTINAL  No active issue  P:   Clear liquid diet for now    HEMATOLOGIC No active issue  P:  F/u CBC  Sub q heparin   INFECTIOUS CAP  P:   F/u cultures  Vancomycin, levaquin as above (PCN allergic)  Check pct   ENDOCRINE Hyperglycemia  P:   SSI   NEUROLOGIC No active issue  P:   Supportive care    FAMILY  - Updates: husband and son updated at beside 5/8.  Confirmed full code with pt and husband.   - Inter-disciplinary family meet or Palliative Care meeting due by:  day [place], NP 04/20/2016  5:57 AM Pager: [phone] or [phone]

## 2016-04-20 NOTE — ED Notes (Signed)
Dr. Gardner at bedside 

## 2016-04-21 ENCOUNTER — Inpatient Hospital Stay (HOSPITAL_COMMUNITY): Payer: Medicare Other

## 2016-04-21 LAB — CBC
HEMATOCRIT: 31.6 % — AB (ref 36.0–46.0)
HEMOGLOBIN: 10.2 g/dL — AB (ref 12.0–15.0)
MCH: 32.4 pg (ref 26.0–34.0)
MCHC: 32.3 g/dL (ref 30.0–36.0)
MCV: 100.3 fL — AB (ref 78.0–100.0)
Platelets: 175 10*3/uL (ref 150–400)
RBC: 3.15 MIL/uL — ABNORMAL LOW (ref 3.87–5.11)
RDW: 14.6 % (ref 11.5–15.5)
WBC: 16.6 10*3/uL — ABNORMAL HIGH (ref 4.0–10.5)

## 2016-04-21 LAB — URINE CULTURE

## 2016-04-21 LAB — PROCALCITONIN: PROCALCITONIN: 148.36 ng/mL

## 2016-04-21 LAB — BASIC METABOLIC PANEL
Anion gap: 12 (ref 5–15)
BUN: 15 mg/dL (ref 6–20)
CHLORIDE: 110 mmol/L (ref 101–111)
CO2: 17 mmol/L — ABNORMAL LOW (ref 22–32)
CREATININE: 0.89 mg/dL (ref 0.44–1.00)
Calcium: 7.5 mg/dL — ABNORMAL LOW (ref 8.9–10.3)
GFR calc non Af Amer: 56 mL/min — ABNORMAL LOW (ref >60)
Glucose, Bld: 102 mg/dL — ABNORMAL HIGH (ref 65–99)
Potassium: 3.5 mmol/L (ref 3.5–5.1)
Sodium: 139 mmol/L (ref 135–145)

## 2016-04-21 LAB — MAGNESIUM: Magnesium: 1.6 mg/dL — ABNORMAL LOW (ref 1.7–2.4)

## 2016-04-21 LAB — PHOSPHORUS: Phosphorus: 1.8 mg/dL — ABNORMAL LOW (ref 2.5–4.6)

## 2016-04-21 MED ORDER — K PHOS MONO-SOD PHOS DI & MONO 155-852-130 MG PO TABS
250.0000 mg | ORAL_TABLET | Freq: Three times a day (TID) | ORAL | Status: AC
  Administered 2016-04-21 (×3): 250 mg via ORAL
  Filled 2016-04-21 (×3): qty 1

## 2016-04-21 MED ORDER — POTASSIUM CHLORIDE CRYS ER 20 MEQ PO TBCR
20.0000 meq | EXTENDED_RELEASE_TABLET | Freq: Once | ORAL | Status: AC
  Administered 2016-04-21: 20 meq via ORAL
  Filled 2016-04-21: qty 1

## 2016-04-21 NOTE — Progress Notes (Signed)
Infection control called. "There is no need for droplet precautions" Droplet precautions cancled

## 2016-04-21 NOTE — Progress Notes (Signed)
PULMONARY / CRITICAL CARE MEDICINE   Name: Amanda Long MRN: [id number] DOB: 01-05-1925    ADMISSION DATE:  04/19/2016  REFERRING MD:  EDP  CHIEF COMPLAINT:  Sepsis   HISTORY OF PRESENT ILLNESS:   82yo female with hx pulmonary fibrosis, HTN presented 5/7 with 2 day hx sob, worsening cough, fevers/chills.  In ER was hypotensive despite >3L fluid resuscitation and Neo gtt started. PCCM called for admit.      SUBJECTIVE:  Denies chest pain or dyspnea Off pressors Low-grade febrile   VITAL SIGNS: BP 154/78 mmHg  Pulse 79  Temp(Src) 98.8 F (37.1 C) (Oral)  Resp 32  Ht 5\' 6"  (1.676 m)  Wt 161 lb 13.1 oz (73.4 kg)  BMI 26.13 kg/m2  SpO2 97%  HEMODYNAMICS: CVP:  [3 mmHg-15 mmHg] 11 mmHg  VENTILATOR SETTINGS:    INTAKE / OUTPUT: I/O last 3 completed shifts: In: 7203.9 [I.V.:6903.9; IV Piggyback:300] Out: 2200 [Urine:2200]  PHYSICAL EXAMINATION: General:  Pleasant elderly female, NAD Neuro:  Awake, alert, appropriate, MAE HEENT:  Mm dry, no JVD  Cardiovascular:  s1s2 rrr Lungs:  resps even non labored on [place], scattered dry crackles, diminished L base  Abdomen:  Round, soft, non tender  Musculoskeletal:  Warm and dry, no edema   LABS:  BMET  Recent Labs Lab 04/19/16 2341 04/20/16 0614 04/21/16 0507  NA 135  --  139  K 3.1*  --  3.5  CL 101  --  110  CO2 20*  --  17*  BUN 31*  --  15  CREATININE 1.73* 1.32* 0.89  GLUCOSE 158*  --  102*    Electrolytes  Recent Labs Lab 04/19/16 2341 04/21/16 0507  CALCIUM 8.3* 7.5*  MG  --  1.6*  PHOS  --  1.8*    CBC  Recent Labs Lab 04/19/16 2341 04/20/16 0614 04/21/16 0507  WBC 14.5* 22.3* 16.6*  HGB 11.5* 8.9* 10.2*  HCT 35.2* 27.8* 31.6*  PLT 177 166 175    Coag's No results for input(s): APTT, INR in the last 168 hours.  Sepsis Markers  Recent Labs Lab 04/19/16 2352 04/20/16 0249 04/20/16 0614 04/20/16 0904 04/21/16 0507  LATICACIDVEN 3.03* 1.15  --  1.0  --   PROCALCITON  --   --   >175.00  --  148.36    ABG No results for input(s): PHART, PCO2ART, PO2ART in the last 168 hours.  Liver Enzymes  Recent Labs Lab 04/19/16 2341  AST 27  ALT 31  ALKPHOS 43  BILITOT 0.9  ALBUMIN 3.0*    Cardiac Enzymes  Recent Labs Lab 04/20/16 0441 04/20/16 1000 04/20/16 1558  TROPONINI 0.11* 0.08* 0.13*    Glucose  Recent Labs Lab 04/20/16 0650  GLUCAP 110*    Imaging Dg Chest Port 1 View  04/21/2016  CLINICAL DATA:  Pneumonia. EXAM: PORTABLE CHEST 1 VIEW COMPARISON:  Most recent 04/20/2016. FINDINGS: Cardiomegaly. Support tubes and lines unchanged. BILATERAL pulmonary opacities are increased. This could represent failure or pneumonia. Lower lobe predominance. No pneumothorax. IMPRESSION: Worsening aeration.  Query congestive failure versus pneumonia. Electronically Signed   By: Staci Righter M.D.   On: 04/21/2016 07:23     STUDIES:    CULTURES: BCx2 5/8>>> e coli  Urine 5/8>>>  ANTIBIOTICS: Vancomycin 5/8>>> 5/9 levaquin 5/8>>>  SIGNIFICANT EVENTS:   LINES/TUBES: R IJ CVL (EDP) 5/8>>>  DISCUSSION: 82yo female with hx pulmonary fibrosis admitted 5/8 with sepsis r/t probable CAP.   ASSESSMENT / PLAN:  PULMONARY Pulmonary  fibrosis  CAP  P:   Supplemental O2 as needed  PRN BD  Pulmonary hygiene  Sputum culture if able    CARDIOVASCULAR Septic shock  Hx HTN P:  Off neo gtt Hold home norvac, lopressor, lasix    RENAL AKI -- baseline Scr ~0.9-1.0 -resolved Hypokalemia  P:   Volume as above  F/u chem  Replete K -phos   GASTROINTESTINAL No active issue  P:   Advance diet   HEMATOLOGIC No active issue  P:  F/u CBC  Sub q heparin   INFECTIOUS CAP  ? Source of GNR P:    Dc Vancomycin,  Ct levaquin (PCN allergic)    ENDOCRINE Hyperglycemia  P:   SSI   NEUROLOGIC No active issue  P:   Supportive care    FAMILY  - Updates: husband and son updated at beside 5/9.   - Inter-disciplinary family meet or  Palliative Care meeting due by:  NA  Transfer to floor Source of gram-negative bacteremia not clear-does not seem to be urine, no abdominal symptoms Anticipate discharge soon    Kara Mead MD. FCCP. [facility] Pager [phone] If no response call [phone]   04/21/2016   04/21/2016  1:55 PM

## 2016-04-21 NOTE — Evaluation (Signed)
Physical Therapy Evaluation Patient Details Name: Amanda Long MRN: [id number] DOB: 1925/02/28 Today's Date: 04/21/2016   History of Present Illness  82 year old woman with pulmonary fibrosis presented with 2 days of fevers chills and dry cough noted to be hypotensive in ED with lactate of 3 and left lower lobe infiltrate on chest x-ray.   Clinical Impression  Pt admitted with above diagnosis. Pt currently with functional limitations due to the deficits listed below (see PT Problem List). Pt deconditioned and requiring assist for all mobility at this time. Pt will benefit from skilled PT to increase their independence and safety with mobility to allow discharge to the venue listed below.       Follow Up Recommendations SNF    Equipment Recommendations  Rolling walker with 5" wheels    Recommendations for Other Services       Precautions / Restrictions Precautions Precautions: Fall Precaution Comments: +SOB, inc RR with mobility Restrictions Weight Bearing Restrictions: No      Mobility  Bed Mobility Overal bed mobility: Needs Assistance Bed Mobility: Supine to Sit     Supine to sit: Min assist     General bed mobility comments: minA for trunk elevation, HOB elevated, increased time  Transfers Overall transfer level: Needs assistance Equipment used: 1 person hand held assist Transfers: Sit to/from [facility] Sit to Stand: Min assist;Mod assist Stand pivot transfers: Min assist;Mod assist       General transfer comment: pt transferred to/from [facility] and then to chair. Pt unsteady with antalgic limp and wide base of support. increased RR and SOB however SpO2 >94%  Ambulation/Gait             General Gait Details: deferred today due to SOB with just std pvt  Stairs            Wheelchair Mobility    Modified Rankin (Stroke Patients Only)       Balance Overall balance assessment: Needs assistance         Standing balance  support: Bilateral upper extremity supported Standing balance-Leahy Scale: Poor Standing balance comment: pt attempted to pull down undwerwear and perform hygiene but required assist of PT to steady pt and preven fall                             Pertinent Vitals/Pain Pain Assessment: No/denies pain    Home Living Family/patient expects to be discharged to:: Private residence Living Arrangements:  (sister law) Available Help at Discharge: Family;Available 24 hours/day Type of Home: House Home Access: Level entry     Home Layout: One level (with basement)        Prior Function Level of Independence: Independent         Comments: drives, does grocery shopping     Hand Dominance   Dominant Hand: Right    Extremity/Trunk Assessment   Upper Extremity Assessment: Overall WFL for tasks assessed           Lower Extremity Assessment: Overall WFL for tasks assessed (h/o peripheral neuropathy)      Cervical / Trunk Assessment: Normal  Communication   Communication: HOH  Cognition Arousal/Alertness: Awake/alert Behavior During Therapy: WFL for tasks assessed/performed Overall Cognitive Status: Within Functional Limits for tasks assessed                      General Comments      Exercises  Assessment/Plan    PT Assessment Patient needs continued PT services  PT Diagnosis Difficulty walking;Acute pain   PT Problem List Decreased strength;Decreased range of motion;Decreased activity tolerance;Decreased balance;Decreased mobility;Decreased knowledge of use of DME  PT Treatment Interventions DME instruction;Gait training;Stair training;Functional mobility training;Therapeutic exercise;Balance training;Therapeutic activities   PT Goals (Current goals can be found in the Care Plan section) Acute Rehab PT Goals Patient Stated Goal: home PT Goal Formulation: With patient Time For Goal Achievement: 05/05/16 Potential to Achieve Goals:  Good    Frequency Min 3X/week   Barriers to discharge Decreased caregiver support unsure how much 43yo sister in law can help    Co-evaluation               End of Session Equipment Utilized During Treatment: Gait belt Activity Tolerance: Patient tolerated treatment well Patient left: in chair;with call bell/phone within reach;with nursing/sitter in room Nurse Communication: Mobility status         Time: [id number] PT Time Calculation (min) (ACUTE ONLY): 21 min   Charges:   PT Evaluation $PT Eval Moderate Complexity: 1 Procedure     PT G CodesKingsley Callander 04/21/2016, 4:48 PM   Kittie Plater, PT, DPT Pager #: [phone] Office #: [phone]

## 2016-04-22 LAB — EXPECTORATED SPUTUM ASSESSMENT W REFEX TO RESP CULTURE

## 2016-04-22 LAB — CULTURE, BLOOD (ROUTINE X 2)

## 2016-04-22 LAB — EXPECTORATED SPUTUM ASSESSMENT W GRAM STAIN, RFLX TO RESP C

## 2016-04-22 MED ORDER — FUROSEMIDE 20 MG PO TABS
20.0000 mg | ORAL_TABLET | Freq: Every day | ORAL | Status: DC
  Administered 2016-04-22 – 2016-04-23 (×2): 20 mg via ORAL
  Filled 2016-04-22 (×2): qty 1

## 2016-04-22 MED ORDER — TRAZODONE HCL 50 MG PO TABS: 50.0000 mg | ORAL_TABLET | Freq: Every evening | ORAL | Status: DC | PRN

## 2016-04-22 MED ORDER — METOPROLOL SUCCINATE ER 25 MG PO TB24
25.0000 mg | ORAL_TABLET | Freq: Every day | ORAL | Status: DC
  Administered 2016-04-22 – 2016-04-23 (×2): 25 mg via ORAL
  Filled 2016-04-22 (×2): qty 1

## 2016-04-22 MED ORDER — PRAVASTATIN SODIUM 20 MG PO TABS
20.0000 mg | ORAL_TABLET | Freq: Every day | ORAL | Status: DC
  Administered 2016-04-22: 20 mg via ORAL
  Filled 2016-04-22: qty 1

## 2016-04-22 MED ORDER — AMLODIPINE BESYLATE 5 MG PO TABS
5.0000 mg | ORAL_TABLET | Freq: Every day | ORAL | Status: DC
  Administered 2016-04-22 – 2016-04-23 (×2): 5 mg via ORAL
  Filled 2016-04-22 (×2): qty 1

## 2016-04-22 MED ORDER — GABAPENTIN 100 MG PO CAPS
200.0000 mg | ORAL_CAPSULE | Freq: Two times a day (BID) | ORAL | Status: DC
  Administered 2016-04-22 – 2016-04-23 (×3): 200 mg via ORAL
  Filled 2016-04-22 (×3): qty 2

## 2016-04-22 MED ORDER — WHITE PETROLATUM GEL
Status: AC
  Administered 2016-04-22: 10:00:00
  Filled 2016-04-22: qty 1

## 2016-04-22 NOTE — Clinical Social Work Placement (Addendum)
   CLINICAL SOCIAL WORK PLACEMENT  NOTE  Date:  04/22/2016  Patient Details  Name: Amanda Long MRN: [id number] Date of Birth: 1925/11/18  Clinical Social Work is seeking post-discharge placement for this patient at the [place] level of care (*CSW will initial, date and re-position this form in  chart as items are completed):      Patient/family provided with [place] Work Department's list of facilities offering this level of care within the geographic area requested by the patient (or if unable, by the patient's family).      Patient/family informed of their freedom to choose among providers that offer the needed level of care, that participate in Medicare, Medicaid or managed care program needed by the patient, have an available bed and are willing to accept the patient.      Patient/family informed of [facility]'s ownership interest in [facility] and [facility], as well as of the fact that they are under no obligation to receive care at these facilities.  PASRR submitted to EDS on 04/22/16     PASRR number received on 04/22/16     Existing PASRR number confirmed on       FL2 transmitted to all facilities in geographic area requested by pt/family on 04/22/16     FL2 transmitted to all facilities within larger geographic area on       Patient informed that his/her managed care company has contracts with or will negotiate with certain facilities, including the following:         04/23/16   Patient/family informed of bed offers received.  Patient chooses bed at     [facility]  Physician recommends and patient chooses bed at      Patient to be transferred to   [facility] on  .04/23/16  Patient to be transferred to facility by   [place]    Patient family notified on   04/23/16 of transfer.  Name of family member notified:     Family at bedside   PHYSICIAN Please sign FL2     Additional Comment:     _______________________________________________ Benard Halsted, [place] 04/22/2016, 2:16 PM   Addendum by Domenica Reamer, [place] Social Worker [phone]

## 2016-04-22 NOTE — Clinical Social Work Note (Signed)
Clinical Social Work Assessment  Patient Details  Name: Amanda Long MRN: [id number] Date of Birth: 1925/03/05  Date of referral:  04/22/16               Reason for consult:  Facility Placement                Permission sought to share information with:  Customer service manager, Other Permission granted to share information::  Yes, Verbal Permission Granted  Name::     Christia Reading  Agency::  [facility] SNFs  Relationship::     Contact Information:  [phone]  Housing/Transportation Living arrangements for the past 2 months:  [place] of Information:  Patient, Other (Comment Required) (Sister-in-law, cousins, niece) Patient Interpreter Needed:  None Criminal Activity/Legal Involvement Pertinent to Current Situation/Hospitalization:  No - Comment as needed Significant Relationships:  Other Family Members Lives with:  Relatives (Sister in law) Do you feel safe going back to the place where you live?  No Need for family participation in patient care:  Yes (Comment)  Care giving concerns:  CSW received referral for possible SNF placement at time of discharge. CSW met with patient and patient's family (sister-in-law, cousins, niece) at bedside regarding PT recommendation of SNF placement at time of discharge. Per patient's family, patient lives with her sister-in-law who is currently unable to care for patient at their home given patient's current physical needs and fall risk. Patient and patient's family expressed understanding of PT recommendation and are agreeable to SNF placement at time of discharge. CSW to continue to follow and assist with discharge planning needs.   Social Worker assessment / plan:  CSW spoke with patient and patient's family concerning possibility of rehab at [facility] before returning home.  Employment status:  Retired Forensic scientist:  Medicare PT Recommendations:  [place] / Referral to community  resources:  [place]  Patient/Family's Response to care:  Patient and patient's family recognize need for rehab before returning home and are agreeable to a SNF in [place]. Patient reported preference for [facility] or [facility].  Patient/Family's Understanding of and Emotional Response to Diagnosis, Current Treatment, and Prognosis:  Patient is realistic regarding therapy needs. No questions/concerns about plan or treatment.    Emotional Assessment Appearance:  Appears stated age Attitude/Demeanor/Rapport:   (Appropriate) Affect (typically observed):  Accepting, Appropriate Orientation:  Oriented to Self, Oriented to Place, Oriented to  Time, Oriented to Situation Alcohol / Substance use:  Not Applicable Psych involvement (Current and /or in the community):  No (Comment)  Discharge Needs  Concerns to be addressed:  Care Coordination Readmission within the last 30 days:  No Current discharge risk:  None Barriers to Discharge:  Continued Medical Work up   [employer], [place] 04/22/2016, 2:14 PM

## 2016-04-22 NOTE — Progress Notes (Signed)
Pt expressed sob on exertion and weakness and asked to divert ambulating without o2 on for later in the day. Will attempt again later.

## 2016-04-22 NOTE — Progress Notes (Signed)
Reviewed & agree with plan  

## 2016-04-22 NOTE — NC FL2 (Signed)
[place] MEDICAID FL2 LEVEL OF CARE SCREENING TOOL     IDENTIFICATION  Patient Name: LAKYNN BRIETZKE Birthdate: [birth year] Sex: female Admission Date (Current Location): 04/19/2016  [facility] and [place] Number:  Herbalist and Address:  [facility], [place] [address], [place], [place] [address]      Provider Number: [id number]  Attending Physician Name and Address:  Brand Males, MD  Relative Name and Phone Number:  Christia Reading [phone]    Current Level of Care: Hospital Recommended Level of Care: [place] Prior Approval Number:    Date Approved/Denied:   PASRR Number: [id number]  Discharge Plan: SNF    Current Diagnoses: Patient Active Problem List   Diagnosis Date Noted  . Septic shock ([place]) 04/20/2016  . Acute bronchitis 04/13/2016  . Cough 10/20/2013  . Personal history of colonic polyps 05/10/2012  . Trigeminal neuralgia 05/14/2011  . PULMONARY FIBROSIS 05/31/2009  . BREAST CANCER, HX OF 11/28/2008  . HYPERLIPIDEMIA 11/23/2007  . PERIPHERAL NEUROPATHY 11/23/2007  . Hearing loss 11/23/2007  . Essential hypertension 11/23/2007  . GERD 11/23/2007    Orientation RESPIRATION BLADDER Height & Weight     Self, Time, Situation, Place  O2 (Nasal cannula 2L) Incontinent Weight: 74.9 kg (165 lb 2 oz) Height:  5\' 6"  (167.6 cm)  BEHAVIORAL SYMPTOMS/MOOD NEUROLOGICAL BOWEL NUTRITION STATUS      Continent Diet (Please see DC summary)  AMBULATORY STATUS COMMUNICATION OF NEEDS Skin   Extensive Assist Verbally Normal                       Personal Care Assistance Level of Assistance  Bathing, Feeding, Dressing Bathing Assistance: Maximum assistance Feeding assistance: Independent Dressing Assistance: Limited assistance     Functional Limitations Info             SPECIAL CARE FACTORS FREQUENCY  PT (By licensed PT)     PT Frequency: min 3x/week              Contractures      Additional  Factors Info  Code Status, Allergies Code Status Info: Full Allergies Info: Codeine, Dilaudid, Meperidine Hcl, Penicillins           Current Medications (04/22/2016):  This is the current hospital active medication list Current Facility-Administered Medications  Medication Dose Route Frequency Provider Last Rate Last Dose  . 0.9 %  sodium chloride infusion  250 mL Intravenous PRN Marijean Heath, NP      . amLODipine (NORVASC) tablet 5 mg  5 mg Oral Daily Donita Brooks, NP      . aspirin EC tablet 81 mg  81 mg Oral Daily Marijean Heath, NP   81 mg at 04/22/16 0934  . calcium-vitamin D (OSCAL WITH D) 500-200 MG-UNIT per tablet 1 tablet  1 tablet Oral Once per day on Mon Thu Marijean Heath, NP      . carbamazepine (TEGRETOL XR) 12 hr tablet 200 mg  200 mg Oral BID Marijean Heath, NP   200 mg at 04/22/16 [id number]  . furosemide (LASIX) tablet 20 mg  20 mg Oral Daily Donita Brooks, NP      . gabapentin (NEURONTIN) capsule 200 mg  200 mg Oral BID Donita Brooks, NP      . guaiFENesin-dextromethorphan (ROBITUSSIN DM) 100-10 MG/5ML syrup 10 mL  10 mL Oral Q4H PRN Anders Simmonds, MD   10 mL at 04/21/16 1703  .  heparin injection 5,000 Units  5,000 Units Subcutaneous Q8H Marijean Heath, NP   5,000 Units at 04/22/16 [phone]  . levofloxacin (LEVAQUIN) IVPB 750 mg  750 mg Intravenous Q48H Franky Macho, RPH   750 mg at 04/22/16 0002  . metoprolol succinate (TOPROL-XL) 24 hr tablet 25 mg  25 mg Oral Daily Donita Brooks, NP      . ondansetron (ZOFRAN) injection 4 mg  4 mg Intravenous Q6H PRN Marijean Heath, NP      . pravastatin (PRAVACHOL) tablet 20 mg  20 mg Oral QHS Donita Brooks, NP      . traZODone (DESYREL) tablet 50 mg  50 mg Oral QHS PRN Donita Brooks, NP      . white petrolatum (VASELINE) gel              Discharge Medications: Please see discharge summary for a list of discharge medications.  Relevant Imaging Results:  Relevant Lab Results:   Additional  Information SSN: [place], [place]

## 2016-04-22 NOTE — Progress Notes (Signed)
PULMONARY / CRITICAL CARE MEDICINE   Name: KADEJAH BOELMAN MRN: [id number] DOB: 03-Dec-1925    ADMISSION DATE:  04/19/2016  REFERRING MD:  EDP  CHIEF COMPLAINT:  Sepsis   HISTORY OF PRESENT ILLNESS:  82yo female with hx pulmonary fibrosis, HTN presented 5/7 with 2 day hx sob, worsening cough, fevers/chills.  In ER was hypotensive despite >3L fluid resuscitation and Neo gtt started. PCCM called for admit.     SUBJECTIVE:  Tmax 100.1, no acute events overnight.  Pt reports inability to sleep last pm.  Has not been out of bed yet.  Reports mild SOB, dry cough.   VITAL SIGNS: BP 152/75 mmHg  Pulse 97  Temp(Src) 99.6 F (37.6 C) (Oral)  Resp 18  Ht 5\' 6"  (1.676 m)  Wt 165 lb 2 oz (74.9 kg)  BMI 26.66 kg/m2  SpO2 96%  HEMODYNAMICS: CVP:  [11 mmHg] 11 mmHg  VENTILATOR SETTINGS:    INTAKE / OUTPUT: I/O last 3 completed shifts: In: 2240 [P.O.:240; I.V.:1700; IV Piggyback:300] Out: 1275 [Urine:1275]  PHYSICAL EXAMINATION: General:  Pleasant elderly female, NAD Neuro:  Awake, alert, appropriate, MAE HEENT:  Mm dry, no JVD  Cardiovascular:  s1s2 rrr Lungs:  resps even non labored on 3L [place], scattered dry crackles, diminished L base  Abdomen:  Round, soft, non tender  Musculoskeletal:  Warm and dry, no edema   LABS:  BMET  Recent Labs Lab 04/19/16 2341 04/20/16 0614 04/21/16 0507  NA 135  --  139  K 3.1*  --  3.5  CL 101  --  110  CO2 20*  --  17*  BUN 31*  --  15  CREATININE 1.73* 1.32* 0.89  GLUCOSE 158*  --  102*    Electrolytes  Recent Labs Lab 04/19/16 2341 04/21/16 0507  CALCIUM 8.3* 7.5*  MG  --  1.6*  PHOS  --  1.8*    CBC  Recent Labs Lab 04/19/16 2341 04/20/16 0614 04/21/16 0507  WBC 14.5* 22.3* 16.6*  HGB 11.5* 8.9* 10.2*  HCT 35.2* 27.8* 31.6*  PLT 177 166 175    Coag's No results for input(s): APTT, INR in the last 168 hours.  Sepsis Markers  Recent Labs Lab 04/19/16 2352 04/20/16 0249 04/20/16 0614 04/20/16 0904  04/21/16 0507  LATICACIDVEN 3.03* 1.15  --  1.0  --   PROCALCITON  --   --  >175.00  --  148.36    ABG No results for input(s): PHART, PCO2ART, PO2ART in the last 168 hours.  Liver Enzymes  Recent Labs Lab 04/19/16 2341  AST 27  ALT 31  ALKPHOS 43  BILITOT 0.9  ALBUMIN 3.0*    Cardiac Enzymes  Recent Labs Lab 04/20/16 0441 04/20/16 1000 04/20/16 1558  TROPONINI 0.11* 0.08* 0.13*    Glucose  Recent Labs Lab 04/20/16 0650  GLUCAP 110*    Imaging No results found.   STUDIES:    CULTURES: BCx2 5/8 >> e coli >> Urine 5/8 >> 4k colonies of insignificant growth  ANTIBIOTICS: Vancomycin 5/8 >> 5/9 levaquin 5/8 >>  SIGNIFICANT EVENTS: 5/07  Admit with sepsis, to ICU, required vasopressors, unclear source 5/09  Tx out of ICU to floor, off pressors   LINES/TUBES: R IJ CVL (EDP) 5/8 >>  DISCUSSION: 82yo female with hx pulmonary fibrosis admitted 5/8 with sepsis r/t probable CAP.   ASSESSMENT / PLAN:  PULMONARY Pulmonary fibrosis  CAP - positive for rhinovirus Pulmonary Edema - suspect mild component edema on CXR, has been  off home lasix with septic shock + volume P:   Supplemental O2 as needed for sats >90% Will need ambulatory assessment of O2 needs prior to discharge PRN BD  Pulmonary hygiene  Resume diuresis  CARDIOVASCULAR Septic shock - EColi bacteremia  Hx HTN P:  Resume home norvac, lasix  Resume home metoprolol at reduced dose   RENAL AKI - baseline Scr ~0.9-1.0 -resolved Hypokalemia  P:   NS @ KVO Trend BMP / UOP   GASTROINTESTINAL No active issue  P:   Advance diet as tolerated   HEMATOLOGIC No active issue  P:  F/u CBC  Sub q heparin   INFECTIOUS CAP  ? Source of GNR P:   Ct levaquin (PCN allergic)  D4/x abx  NEUROLOGIC: A: Insomnia P: QHS PRN low dose trazodone    FAMILY  - Updates:  Family updated at bedside 5/10.    - Global:  Pt lives at home with her sister.  She assists with caring for her  sister and her neighborhood.  Family concerned she will need a degree of support post discharge.  Will ask PT to evaluate for home recommendations.      Noe Gens, NP-C [facility] Pgr: [phone] or if no answer [phone] 04/22/2016, 10:00 AM

## 2016-04-23 ENCOUNTER — Inpatient Hospital Stay (HOSPITAL_COMMUNITY): Payer: Medicare Other

## 2016-04-23 ENCOUNTER — Encounter (HOSPITAL_COMMUNITY): Payer: Self-pay | Admitting: Pulmonary Disease

## 2016-04-23 LAB — COMPREHENSIVE METABOLIC PANEL
ALT: 18 U/L (ref 14–54)
AST: 23 U/L (ref 15–41)
Albumin: 2.3 g/dL — ABNORMAL LOW (ref 3.5–5.0)
Alkaline Phosphatase: 20 U/L — ABNORMAL LOW (ref 38–126)
Anion gap: 13 (ref 5–15)
BUN: 10 mg/dL (ref 6–20)
CHLORIDE: 104 mmol/L (ref 101–111)
CO2: 21 mmol/L — AB (ref 22–32)
Calcium: 8 mg/dL — ABNORMAL LOW (ref 8.9–10.3)
Creatinine, Ser: 1.08 mg/dL — ABNORMAL HIGH (ref 0.44–1.00)
GFR, EST AFRICAN AMERICAN: 52 mL/min — AB (ref >60)
GFR, EST NON AFRICAN AMERICAN: 44 mL/min — AB (ref >60)
Glucose, Bld: 94 mg/dL (ref 65–99)
POTASSIUM: 3.9 mmol/L (ref 3.5–5.1)
SODIUM: 138 mmol/L (ref 135–145)
Total Bilirubin: 1.9 mg/dL — ABNORMAL HIGH (ref 0.3–1.2)
Total Protein: 5.9 g/dL — ABNORMAL LOW (ref 6.5–8.1)

## 2016-04-23 LAB — CBC
HCT: 33.3 % — ABNORMAL LOW (ref 36.0–46.0)
HEMOGLOBIN: 11.3 g/dL — AB (ref 12.0–15.0)
MCH: 32.8 pg (ref 26.0–34.0)
MCHC: 33.9 g/dL (ref 30.0–36.0)
MCV: 96.8 fL (ref 78.0–100.0)
PLATELETS: 174 10*3/uL (ref 150–400)
RBC: 3.44 MIL/uL — AB (ref 3.87–5.11)
RDW: 14.2 % (ref 11.5–15.5)
WBC: 5.6 10*3/uL (ref 4.0–10.5)

## 2016-04-23 LAB — EXPECTORATED SPUTUM ASSESSMENT W GRAM STAIN, RFLX TO RESP C

## 2016-04-23 MED ORDER — LEVOFLOXACIN 750 MG PO TABS: 750.0000 mg | ORAL_TABLET | ORAL | Status: AC

## 2016-04-23 MED ORDER — GUAIFENESIN-DM 100-10 MG/5ML PO SYRP: 10.0000 mL | ORAL_SOLUTION | ORAL | Status: AC | PRN

## 2016-04-23 MED ORDER — METOPROLOL SUCCINATE ER 25 MG PO TB24: 25.0000 mg | ORAL_TABLET | Freq: Every day | ORAL | Status: AC

## 2016-04-23 MED ORDER — DOCUSATE SODIUM 100 MG PO CAPS: 100.0000 mg | ORAL_CAPSULE | Freq: Every day | ORAL | Status: DC

## 2016-04-23 MED ORDER — DOCUSATE SODIUM 100 MG PO CAPS: 100.0000 mg | ORAL_CAPSULE | Freq: Every day | ORAL | Status: AC

## 2016-04-23 MED ORDER — SENNOSIDES-DOCUSATE SODIUM 8.6-50 MG PO TABS
2.0000 | ORAL_TABLET | Freq: Once | ORAL | Status: AC
  Administered 2016-04-23: 2 via ORAL
  Filled 2016-04-23: qty 2

## 2016-04-23 NOTE — Progress Notes (Signed)
Report given to Colletta Maryland, Therapist, sports at [facility]

## 2016-04-23 NOTE — Progress Notes (Addendum)
SATURATION QUALIFICATIONS: (This note is used to comply with regulatory documentation for home oxygen)  Patient Saturations on Room Air at Rest = 92-95%  Patient Saturations on Room Air while Ambulating = 70% and HR 114, labored breathing  Patient Saturations on 2 Liters of oxygen while Ambulating = 92%  Lissa Merlin, NP paged to make aware

## 2016-04-23 NOTE — Progress Notes (Signed)
Attempted report at [facility]. Message left with call back number  11:38 AM Attempted report    11:44 AM attempted report again. Voicemail left with admission RN

## 2016-04-23 NOTE — Progress Notes (Signed)
Patient will discharge to [facility] Anticipated discharge date: 5/11 Family notified: at bedside Transportation by [facility]- called at 10:50am  CSW signing off.  Domenica Reamer, [place] Social Worker [phone]

## 2016-04-23 NOTE — Progress Notes (Signed)
NP made aware patient is complaining of constipation and states she has not have a bowel movement since Saturday.

## 2016-04-23 NOTE — Discharge Summary (Signed)
Physician Discharge Summary  Patient ID: Amanda Long MRN: [id number] DOB/AGE: Apr 29, 1925 82 y.o.  Admit date: 04/19/2016 Discharge date: 04/23/2016    Discharge Diagnoses:  Pulmonary Fibrosis CAP Pulmonary Edema  Septic Shock secondary to E-Coli Bacteremia  HTN Dyslipidemia  Acute Kidney Injury  Hypokalemia  Constipation Insomnia                                                                      DISCHARGE PLAN BY DIAGNOSIS      Pulmonary Fibrosis CAP Pulmonary Edema  Hypoxemic Respiratory Failure  Discharge Plan: Resume home lasix Complete antibiotics as prescribed Follow up with PCP 1-2 weeks post discharge from SNF Needs 2L O2 with activity  Septic Shock secondary to E-Coli Bacteremia - suspected urinary source HTN Dyslipidemia   Discharge Plan: Complete Levaquin as prescribed  Resume home norvasc, pravachol, ASA  Reduce dose of lopressor at discharge (reduced from 50 mg XL to 25 mg XL)  Acute Kidney Injury - resolved Hypokalemia   Discharge Plan: Follow up BMP in one week to review renal function   Constipation  Discharge Plan: Daily colace  Encourage ambulation, water intake  Insomnia   Discharge Plan: No further interventions.  Situational while inpatient.                    DISCHARGE SUMMARY   Amanda Long is a 82 y.o. 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  In ER, she was noted to be hypotensive, elevated lactic acid (3), acute kidney injury and left lower lobe infiltrate on CXR.  A central line was placed in the ER.  She was started on vasopressors and volume resuscitated.  The patient was admitted to ICU for further care.  She was weaned off vasopressors.  Blood cultures grew E-Coli (sensitive to Cipro,  bactrim, R to ampicillin).  Urine cultures were obtained later in admission and did not show specific species but grew 4000 colonies of insignificant growth.  The patient had no abdominal complaints to support source of E-Coli bacteremia.  In the absence of abdominal symptoms and 4000k growth on urine culture, it is suspected urine was source of infection.  The patient was treated with antibiotics which were renally dosed.  Note, she does have an allergy to PCN.  Antibiotics were narrowed as she improved.  Central line was discontinued.  The patient was transitioned out of ICU to medical floor.  Home medications were restarted (note reduction in lopressor dosing).  She was evaluated by PT and recommended for short term physical rehab at a SNF.  The patient was medically cleared for discharge 5/11 with plans as above.      CULTURES: BCx2 5/8 >> e coli >> resistant to ampicillin, sens cipro, rocephin, bactrim Urine 5/8 >> 4k colonies of insignificant growth  ANTIBIOTICS: Vancomycin 5/8 >> 5/9 levaquin 5/8 >>  SIGNIFICANT EVENTS: 5/07 Admit with sepsis, to ICU, required vasopressors, unclear source 5/09 Tx out of ICU to floor, off pressors   LINES/TUBES: R IJ CVL (  EDP) 5/8 >> 5/9    Discharge Exam: General: Pleasant elderly female, NAD Neuro: Awake, alert, appropriate, MAE HEENT: Mm dry, no JVD  Cardiovascular: s1s2 rrr Lungs: resps even non labored on RA, scattered dry basilar crackles, diminished L base  Abdomen: Round, soft, non tender  Musculoskeletal: Warm and dry, no edema   Filed Vitals:   04/22/16 2100 04/23/16 0628 04/23/16 0820 04/23/16 0823  BP: 153/67 135/74    Pulse: 89 91 95 89  Temp: 99.1 F (37.3 C) 98.5 F (36.9 C)    TempSrc: Oral     Resp: 18 18    Height:      Weight:  156 lb (70.761 kg)    SpO2: 90% 95% 93% 94%     Discharge Labs  BMET  Recent Labs Lab 04/19/16 2341 04/20/16 0614 04/21/16 0507 04/23/16 0638  NA 135  --  139 138  K  3.1*  --  3.5 3.9  CL 101  --  110 104  CO2 20*  --  17* 21*  GLUCOSE 158*  --  102* 94  BUN 31*  --  15 10  CREATININE 1.73* 1.32* 0.89 1.08*  CALCIUM 8.3*  --  7.5* 8.0*  MG  --   --  1.6*  --   PHOS  --   --  1.8*  --     CBC  Recent Labs Lab 04/20/16 0614 04/21/16 0507 04/23/16 0638  HGB 8.9* 10.2* 11.3*  HCT 27.8* 31.6* 33.3*  WBC 22.3* 16.6* 5.6  PLT 166 175 174     Discharge Instructions    Call MD for:  difficulty breathing, headache or visual disturbances    Complete by:  As directed      Call MD for:  extreme fatigue    Complete by:  As directed      Call MD for:  hives    Complete by:  As directed      Call MD for:  persistant dizziness or light-headedness    Complete by:  As directed      Call MD for:  persistant nausea and vomiting    Complete by:  As directed      Call MD for:  severe uncontrolled pain    Complete by:  As directed      Call MD for:  temperature >100.4    Complete by:  As directed      Diet general    Complete by:  As directed      Discharge instructions    Complete by:  As directed   1.  Review medication list carefully as it has changed from admission.  2.  Complete antibiotics as prescribed 3.  Follow up BMP in one week to review renal function     Increase activity slowly    Complete by:  As directed                 Follow-up Information    Schedule an appointment as soon as possible for a visit with Shirline Frees, NP.   Specialty:  Family Medicine   Why:  Call to be seen within 1-2 weeks post discharge.    Contact information:   [address] [place] [address] [phone]          Medication List    STOP taking these medications        ciprofloxacin 250 MG tablet  Commonly known as:  CIPRO     predniSONE 10  MG tablet  Commonly known as:  DELTASONE      TAKE these medications        amLODipine 5 MG tablet  Commonly known as:  NORVASC  TAKE ONE TABLET BY MOUTH ONCE DAILY     aspirin 81  MG EC tablet  Take 81 mg by mouth daily.     CALCIUM 600+D 600-400 MG-UNIT tablet  Generic drug:  Calcium Carbonate-Vitamin D  Take 1 tablet by mouth 2 (two) times a week.     docusate sodium 100 MG capsule  Commonly known as:  COLACE  Take 1 capsule (100 mg total) by mouth daily.  Start taking on:  04/24/2016     furosemide 20 MG tablet  Commonly known as:  LASIX  Take 1 tablet (20 mg total) by mouth daily.     gabapentin 100 MG capsule  Commonly known as:  NEURONTIN  2 tabs twice daily     guaiFENesin-dextromethorphan 100-10 MG/5ML syrup  Commonly known as:  ROBITUSSIN DM  Take 10 mLs by mouth every 4 (four) hours as needed for cough.     ibuprofen 200 MG tablet  Commonly known as:  ADVIL,MOTRIN  Take 200 mg by mouth every 6 (six) hours as needed for fever or mild pain.     levofloxacin 750 MG tablet  Commonly known as:  LEVAQUIN  Take 1 tablet (750 mg total) by mouth every other day.  Start taking on:  04/24/2016     metoprolol succinate 25 MG 24 hr tablet  Commonly known as:  TOPROL-XL  Take 1 tablet (25 mg total) by mouth daily.     omeprazole 20 MG capsule  Commonly known as:  PRILOSEC  Take 1 capsule (20 mg total) by mouth daily as needed.     pravastatin 20 MG tablet  Commonly known as:  PRAVACHOL  Take 1 tablet (20 mg total) by mouth at bedtime.     TEGRETOL-XR 100 MG 12 hr tablet  Generic drug:  carbamazepine  Take 2 tablets (200 mg total) by mouth 2 (two) times daily. **NEEDS O/V FOR FURTHER REFILLS**     vitamin E 400 UNIT capsule  Take 400 Units by mouth 2 (two) times daily.          Disposition:  SNF for short term rehab efforts.   Discharged Condition: Amanda Long has met maximum benefit of inpatient care and is medically stable and cleared for discharge.  Patient is pending follow up as above.      Time spent on disposition:  Greater than 35 minutes.   Signed: Noe Gens, NP-C [facility] Pgr:  [phone] Office: [phone]

## 2016-04-23 NOTE — Progress Notes (Signed)
Called nephew and personally updated on plan of care for discharge.     Noe Gens, NP-C [facility] Pgr: [phone] or if no answer [phone] 04/23/2016, 9:20 AM

## 2016-04-23 NOTE — Care Management Important Message (Signed)
Important Message  Patient Details  Name: Amanda Long MRN: [id number] Date of Birth: 17-Apr-1925   Medicare Important Message Given:  Yes    Ishani Goldwasser Abena 04/23/2016, 1:16 PM

## 2016-05-14 DEATH — deceased

## 2016-07-10 ENCOUNTER — Encounter: Payer: Medicare Other | Admitting: Adult Health

## 2016-07-14 ENCOUNTER — Ambulatory Visit: Payer: Medicare Other | Admitting: Pulmonary Disease

## 2017-08-01 IMAGING — DX DG CHEST 2V
2 series · 2 of 2 positions shown · non-contrast
Comparison: none

[chest pa]
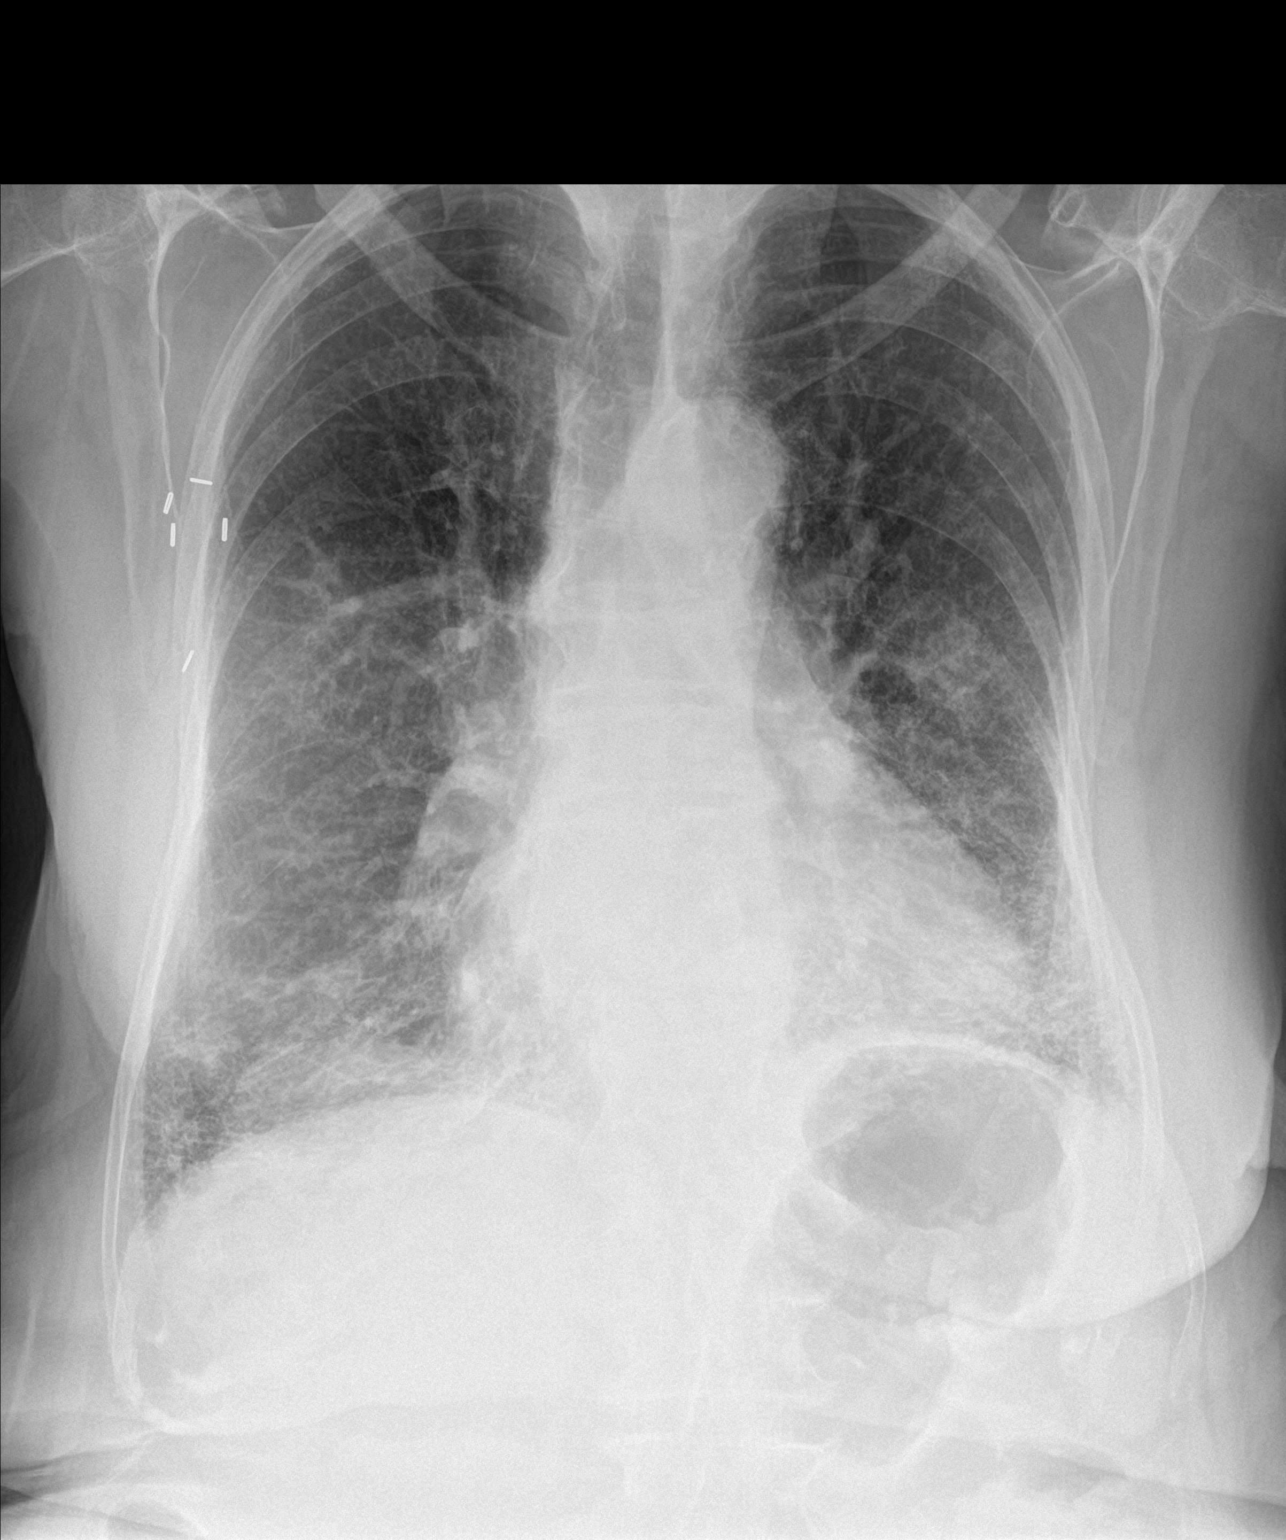

[chest lat]
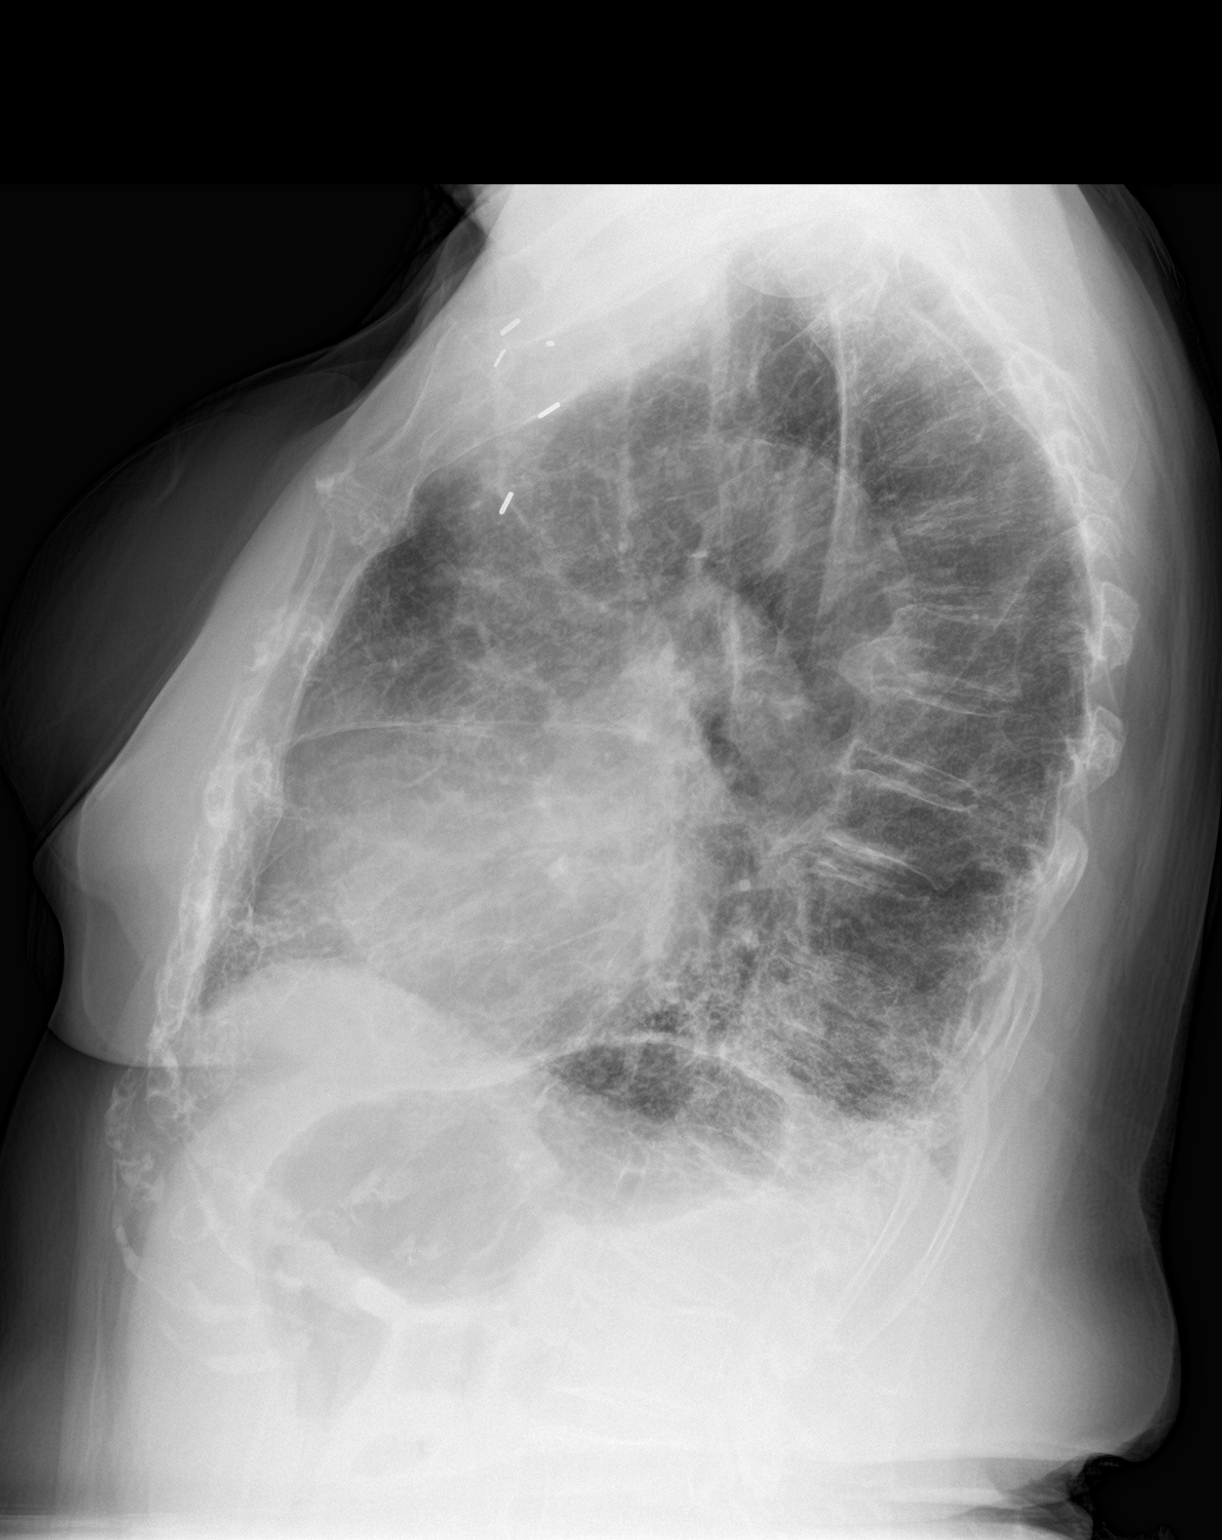

[2 of 2 positions shown; findings below may reference images not displayed]

Canned report from images found in remote index.

Refer to host system for actual result text.

## 2017-09-03 ENCOUNTER — Encounter: Payer: Self-pay | Admitting: Adult Health
